# Patient Record
Sex: Female | Born: 1965 | Race: White | Hispanic: No | Marital: Married | Smoking: Never smoker
Health system: Southern US, Community
[De-identification: ages and names within clinical notes are randomized; demographics above are authoritative.]

## PROBLEM LIST (undated history)

## (undated) DIAGNOSIS — R599 Enlarged lymph nodes, unspecified: Secondary | ICD-10-CM

## (undated) DIAGNOSIS — Z860100 Personal history of colon polyps, unspecified: Secondary | ICD-10-CM

## (undated) DIAGNOSIS — R519 Headache, unspecified: Secondary | ICD-10-CM

## (undated) DIAGNOSIS — I493 Ventricular premature depolarization: Secondary | ICD-10-CM

## (undated) DIAGNOSIS — D72829 Elevated white blood cell count, unspecified: Secondary | ICD-10-CM

## (undated) DIAGNOSIS — D219 Benign neoplasm of connective and other soft tissue, unspecified: Secondary | ICD-10-CM

## (undated) DIAGNOSIS — J189 Pneumonia, unspecified organism: Secondary | ICD-10-CM

## (undated) DIAGNOSIS — K219 Gastro-esophageal reflux disease without esophagitis: Secondary | ICD-10-CM

## (undated) DIAGNOSIS — T8859XA Other complications of anesthesia, initial encounter: Secondary | ICD-10-CM

## (undated) DIAGNOSIS — E162 Hypoglycemia, unspecified: Secondary | ICD-10-CM

## (undated) DIAGNOSIS — F419 Anxiety disorder, unspecified: Secondary | ICD-10-CM

## (undated) DIAGNOSIS — R591 Generalized enlarged lymph nodes: Secondary | ICD-10-CM

## (undated) DIAGNOSIS — F32A Depression, unspecified: Secondary | ICD-10-CM

## (undated) DIAGNOSIS — R079 Chest pain, unspecified: Secondary | ICD-10-CM

## (undated) HISTORY — PX: ESOPHAGOGASTRODUODENOSCOPY ENDOSCOPY: SHX5814

## (undated) HISTORY — DX: Enlarged lymph nodes, unspecified: R59.9

## (undated) HISTORY — DX: Personal history of colon polyps, unspecified: Z86.0100

## (undated) HISTORY — DX: Elevated white blood cell count, unspecified: D72.829

## (undated) HISTORY — DX: Benign neoplasm of connective and other soft tissue, unspecified: D21.9

## (undated) HISTORY — PX: BACK SURGERY: SHX140

## (undated) HISTORY — DX: Generalized enlarged lymph nodes: R59.1

## (undated) HISTORY — PX: COLONOSCOPY: SHX174

## (undated) HISTORY — DX: Anxiety disorder, unspecified: F41.9

## (undated) HISTORY — DX: Personal history of colonic polyps: Z86.010

## (undated) HISTORY — PX: OTHER SURGICAL HISTORY: SHX169

---

## 2002-06-22 ENCOUNTER — Encounter: Payer: Self-pay | Admitting: Obstetrics and Gynecology

## 2002-06-22 ENCOUNTER — Encounter: Admission: RE | Admit: 2002-06-22 | Discharge: 2002-06-22 | Payer: Self-pay | Admitting: Obstetrics and Gynecology

## 2003-05-03 ENCOUNTER — Encounter: Admission: RE | Admit: 2003-05-03 | Discharge: 2003-05-03 | Payer: Self-pay | Admitting: Psychiatry

## 2003-05-09 ENCOUNTER — Encounter: Admission: RE | Admit: 2003-05-09 | Discharge: 2003-05-09 | Payer: Self-pay | Admitting: Psychiatry

## 2003-05-23 ENCOUNTER — Other Ambulatory Visit: Admission: RE | Admit: 2003-05-23 | Discharge: 2003-05-23 | Payer: Self-pay | Admitting: Obstetrics and Gynecology

## 2003-06-13 ENCOUNTER — Encounter: Admission: RE | Admit: 2003-06-13 | Discharge: 2003-06-13 | Payer: Self-pay | Admitting: Psychiatry

## 2003-06-22 ENCOUNTER — Encounter: Admission: RE | Admit: 2003-06-22 | Discharge: 2003-06-22 | Payer: Self-pay | Admitting: Psychiatry

## 2003-08-02 ENCOUNTER — Encounter: Admission: RE | Admit: 2003-08-02 | Discharge: 2003-08-02 | Payer: Self-pay | Admitting: Psychiatry

## 2003-09-13 ENCOUNTER — Encounter: Admission: RE | Admit: 2003-09-13 | Discharge: 2003-09-13 | Payer: Self-pay | Admitting: Internal Medicine

## 2003-09-13 ENCOUNTER — Encounter: Payer: Self-pay | Admitting: Internal Medicine

## 2003-11-30 ENCOUNTER — Encounter: Admission: RE | Admit: 2003-11-30 | Discharge: 2003-11-30 | Payer: Self-pay | Admitting: Internal Medicine

## 2004-03-26 ENCOUNTER — Encounter: Admission: RE | Admit: 2004-03-26 | Discharge: 2004-03-26 | Payer: Self-pay | Admitting: Family Medicine

## 2004-08-11 ENCOUNTER — Other Ambulatory Visit: Admission: RE | Admit: 2004-08-11 | Discharge: 2004-08-11 | Payer: Self-pay | Admitting: Obstetrics and Gynecology

## 2004-08-17 ENCOUNTER — Emergency Department (HOSPITAL_COMMUNITY): Admission: EM | Admit: 2004-08-17 | Discharge: 2004-08-17 | Payer: Self-pay | Admitting: *Deleted

## 2005-09-07 ENCOUNTER — Other Ambulatory Visit: Admission: RE | Admit: 2005-09-07 | Discharge: 2005-09-07 | Payer: Self-pay | Admitting: Obstetrics and Gynecology

## 2005-09-24 ENCOUNTER — Encounter: Admission: RE | Admit: 2005-09-24 | Discharge: 2005-09-24 | Payer: Self-pay | Admitting: Obstetrics and Gynecology

## 2006-10-04 ENCOUNTER — Other Ambulatory Visit: Admission: RE | Admit: 2006-10-04 | Discharge: 2006-10-04 | Payer: Self-pay | Admitting: Obstetrics and Gynecology

## 2006-11-02 ENCOUNTER — Encounter: Admission: RE | Admit: 2006-11-02 | Discharge: 2006-11-02 | Payer: Self-pay | Admitting: Obstetrics and Gynecology

## 2006-11-02 IMAGING — MG MM SCREEN MAMMOGRAM BILATERAL
5 series · 5 of 5 positions shown · non-contrast
Comparison: none

DG SCREEN MAMMOGRAM BILATERAL
Bilateral CC and MLO view(s) were taken.

DIGITAL SCREENING MAMMOGRAM WITH CAD:
There is a  dense fibroglandular pattern.  No masses or malignant type calcifications are 
identified.  Compared with prior studies.

[R CC]
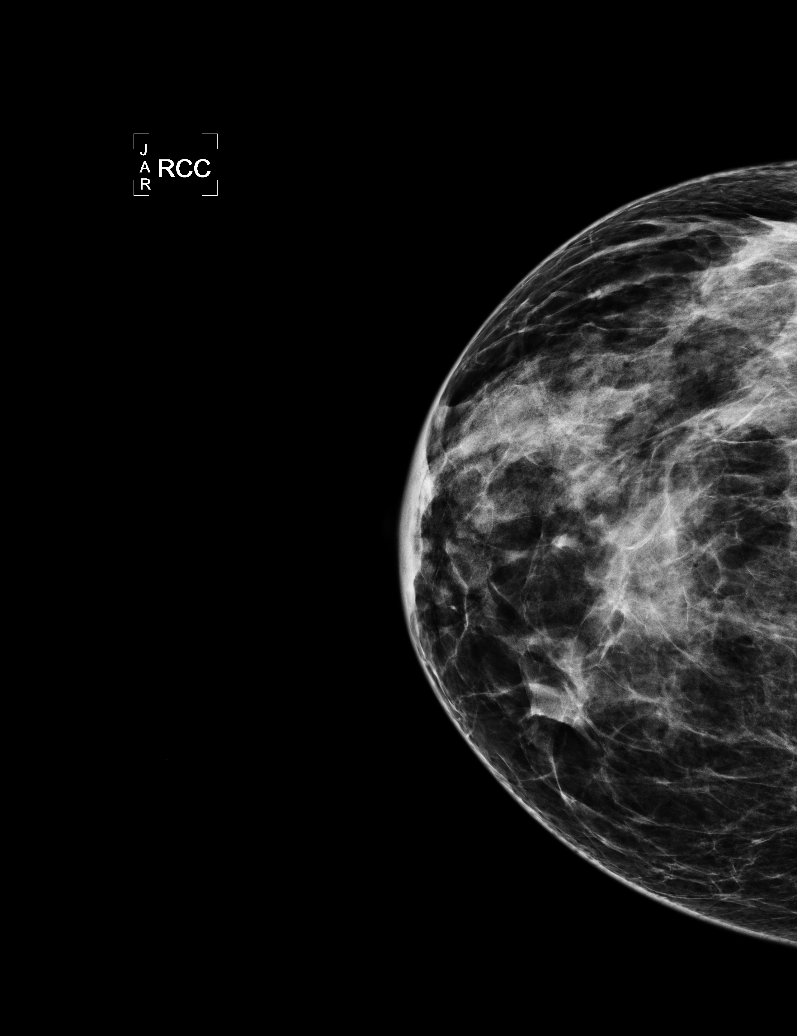

[L CC]
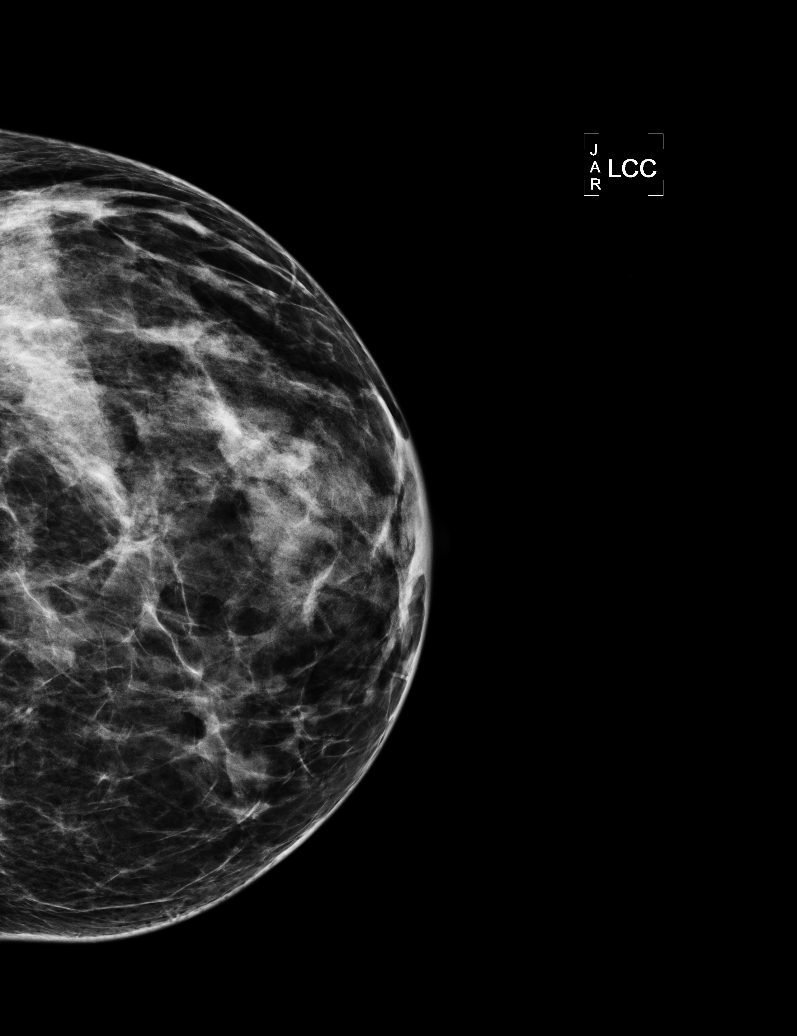

[L MLO]
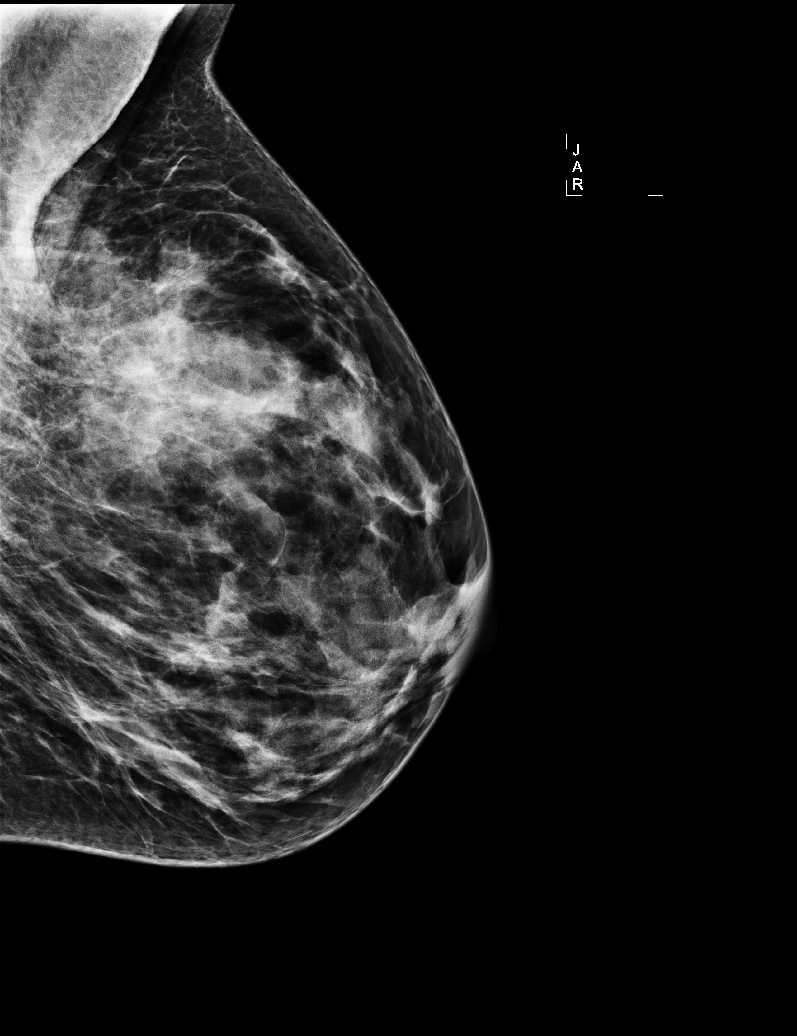

[R MLO (1 of 2)]
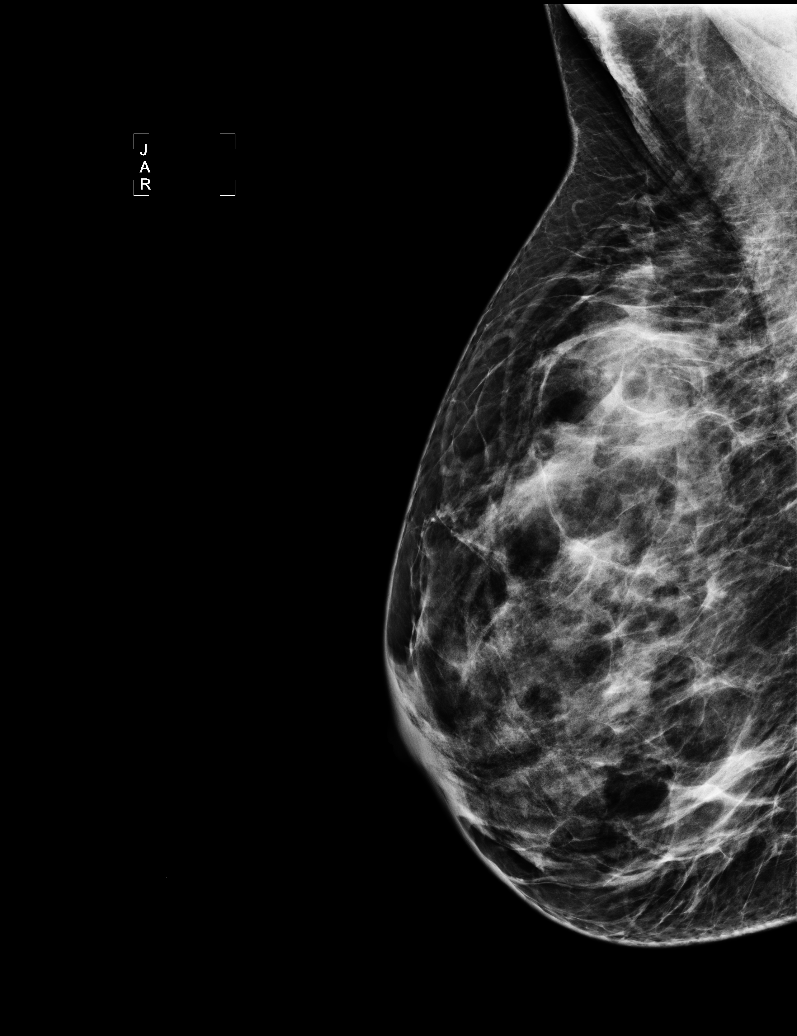

[R MLO (2 of 2)]
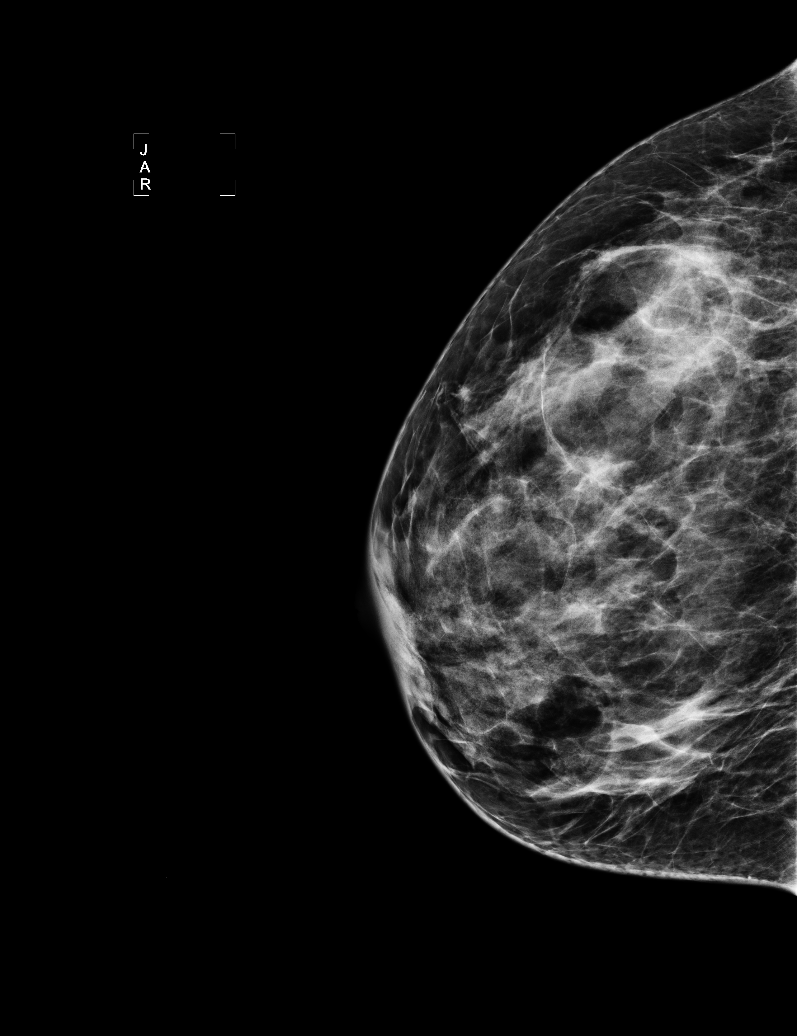

[5 of 5 positions shown; findings below may reference images not displayed]

IMPRESSION: No specific mammographic evidence of malignancy.  Next screening mammogram is recommended in one 
year.

ASSESSMENT: Negative - BI-RADS 1

Screening mammogram in 1 year.
ANALYZED BY COMPUTER AIDED DETECTION. , THIS PROCEDURE WAS A DIGITAL MAMMOGRAM.

## 2007-06-29 ENCOUNTER — Emergency Department (HOSPITAL_COMMUNITY): Admission: EM | Admit: 2007-06-29 | Discharge: 2007-06-30 | Payer: Self-pay | Admitting: Emergency Medicine

## 2007-11-04 ENCOUNTER — Encounter: Admission: RE | Admit: 2007-11-04 | Discharge: 2007-11-04 | Payer: Self-pay | Admitting: Obstetrics and Gynecology

## 2007-11-04 IMAGING — MG MM SCREEN MAMMOGRAM BILATERAL
4 series · 4 of 4 positions shown · non-contrast
Comparison: none

DG SCREEN MAMMOGRAM BILATERAL
Bilateral CC and MLO view(s) were taken.

DIGITAL SCREENING MAMMOGRAM WITH CAD:
The breast tissue is heterogeneously dense.  No masses or malignant type calcifications are 
identified.  Compared with prior studies.

[R CC]
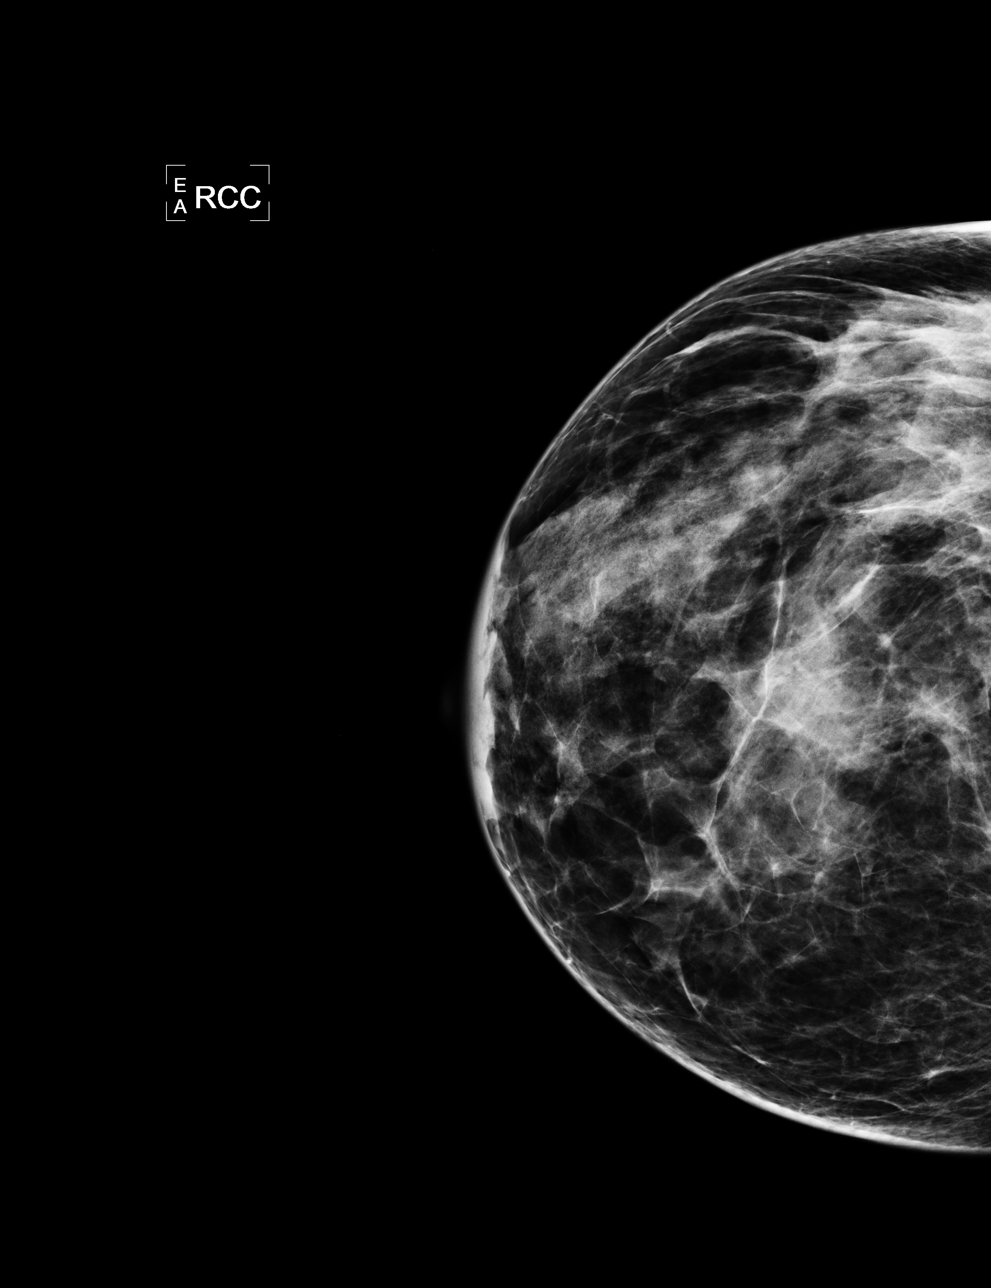

[L CC]
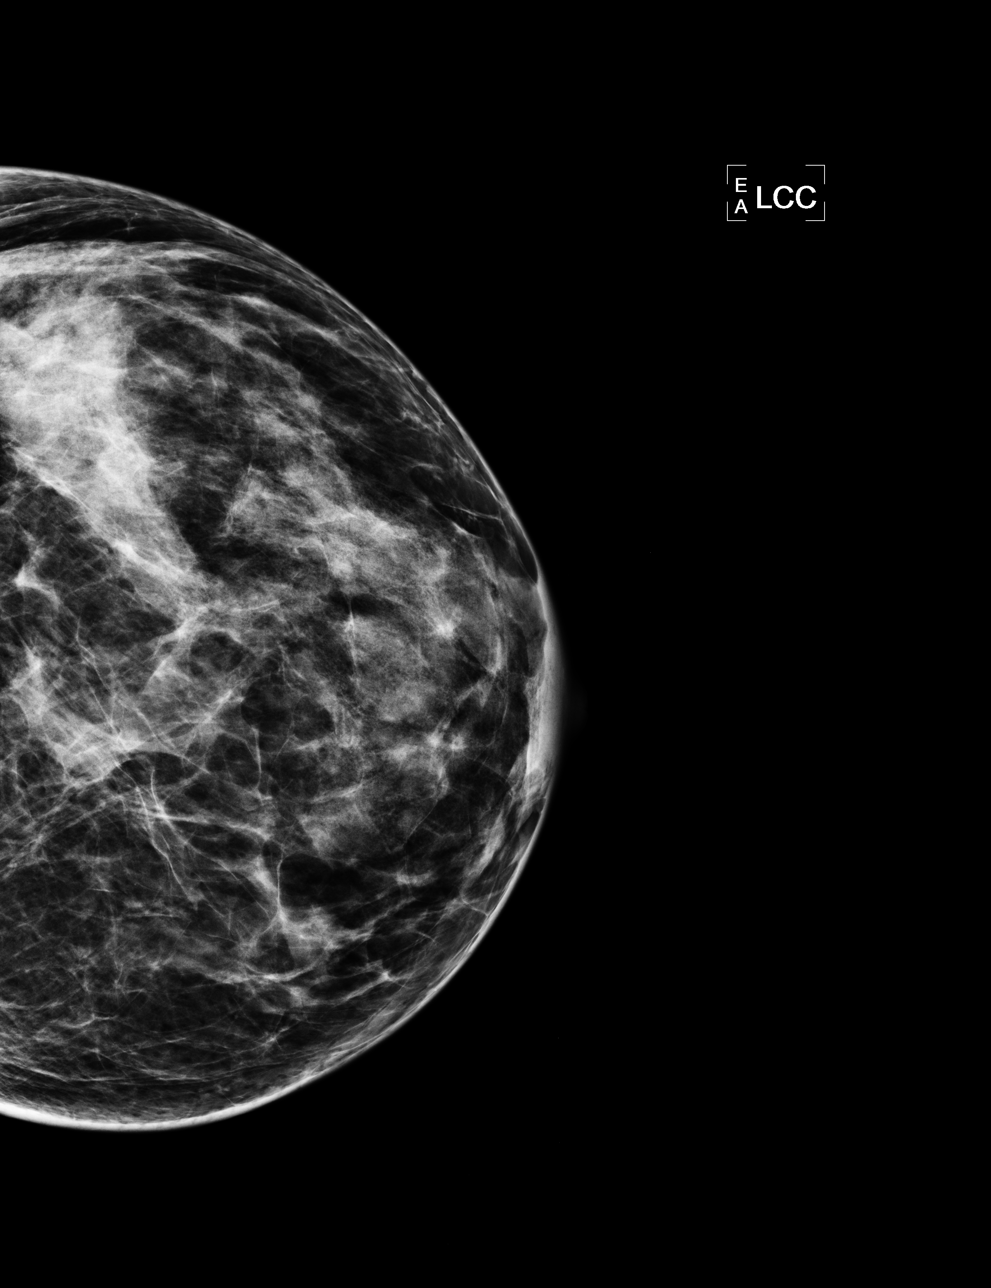

[L MLO]
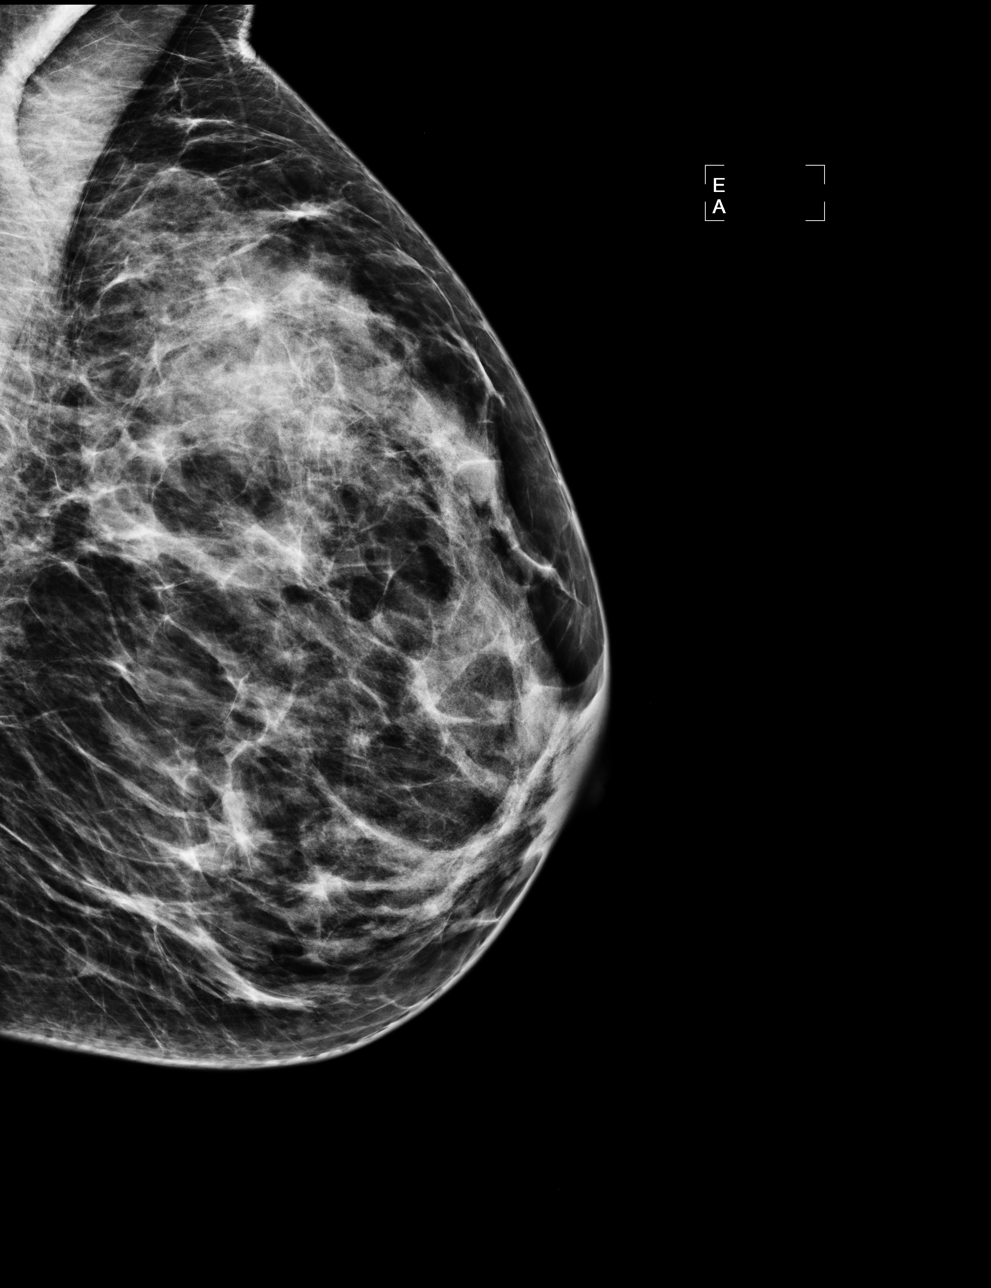

[R MLO]
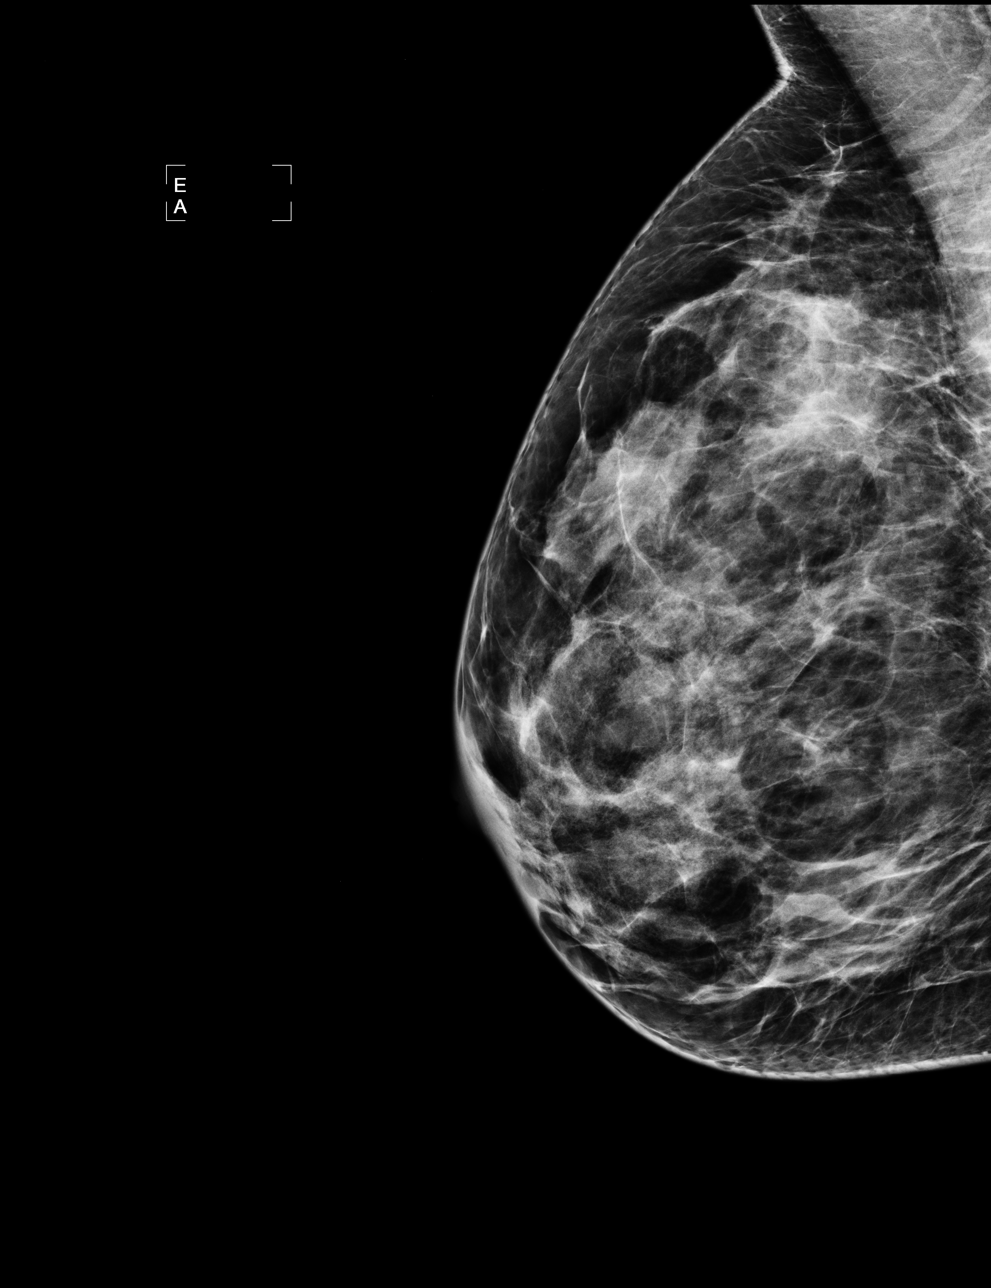

[4 of 4 positions shown; findings below may reference images not displayed]

IMPRESSION: No specific mammographic evidence of malignancy.  Next screening mammogram is recommended in one 
year.

ASSESSMENT: Negative - BI-RADS 1

Screening mammogram in 1 year.
ANALYZED BY COMPUTER AIDED DETECTION. , THIS PROCEDURE WAS A DIGITAL MAMMOGRAM.

## 2007-12-02 IMAGING — US US ABDOMEN COMPLETE
1 series · 14 of 25 positions shown · non-contrast
Comparison: NONE

CLINICAL DATA: Abdominal ???burning??. 

ABDOMINAL ULTRASOUND

[Series 1: us abd · 0.23mm/px · 14 of 75 slices shown]
[im 1/75]
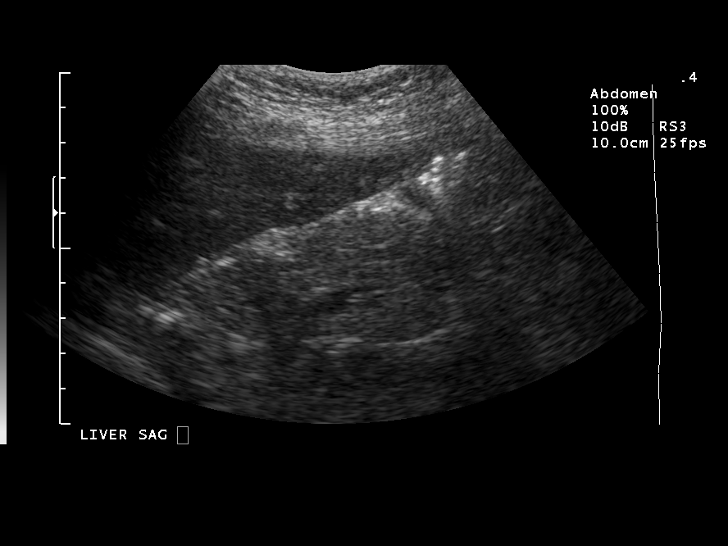
[im 7/75]
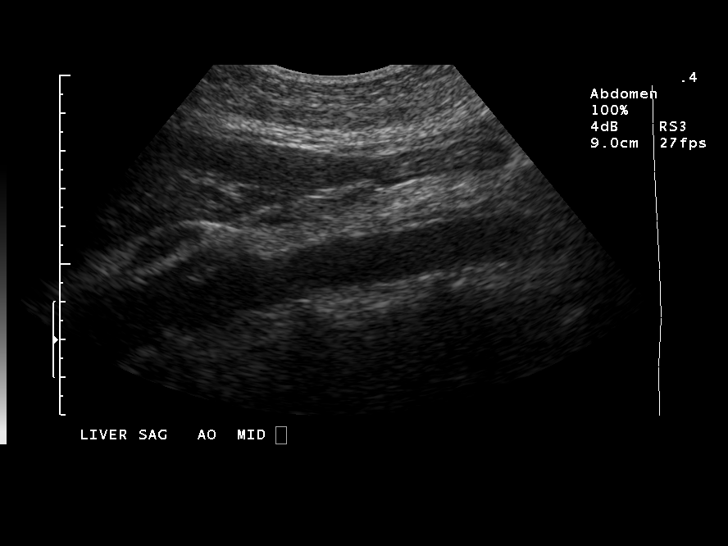
[im 13/75]
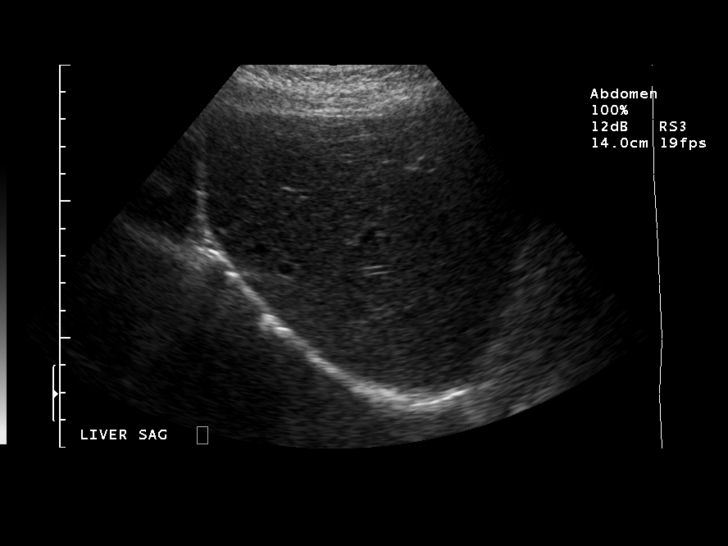
[im 19/75]
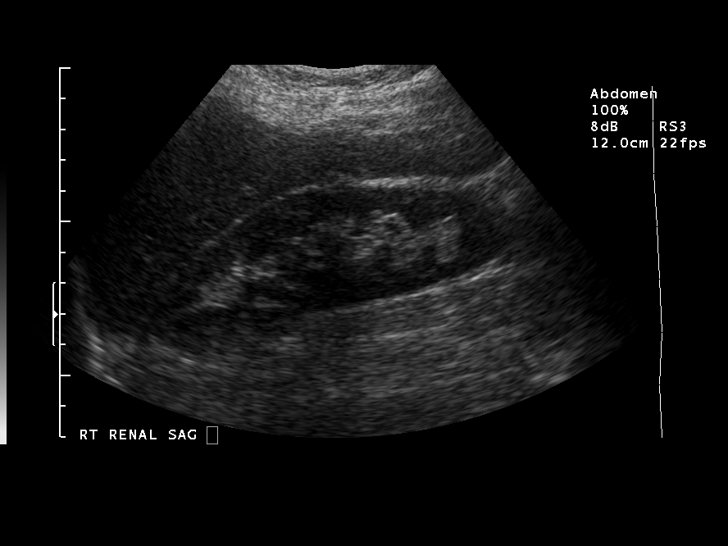
[im 25/75]
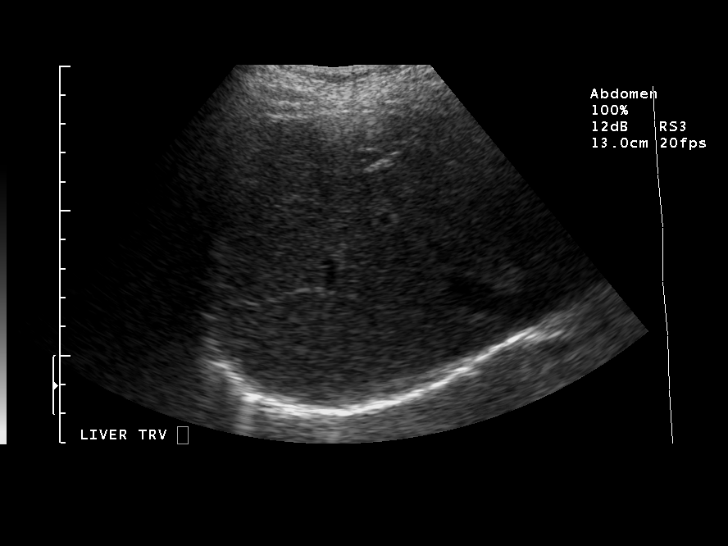
[im 28/75]
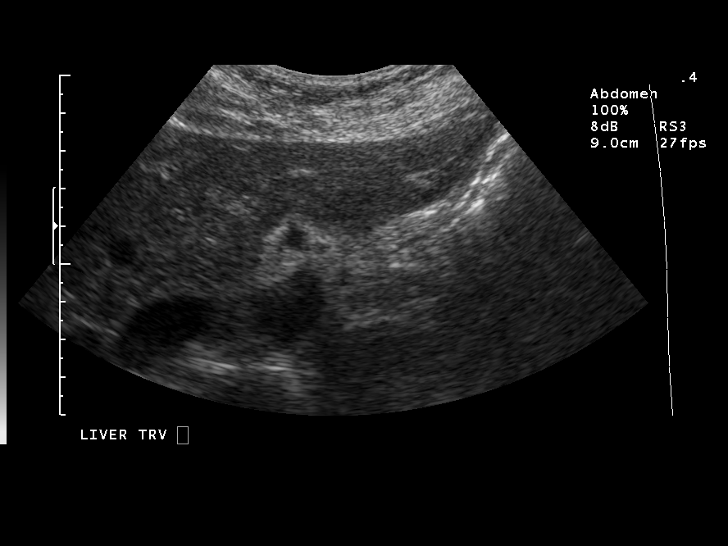
[im 34/75]
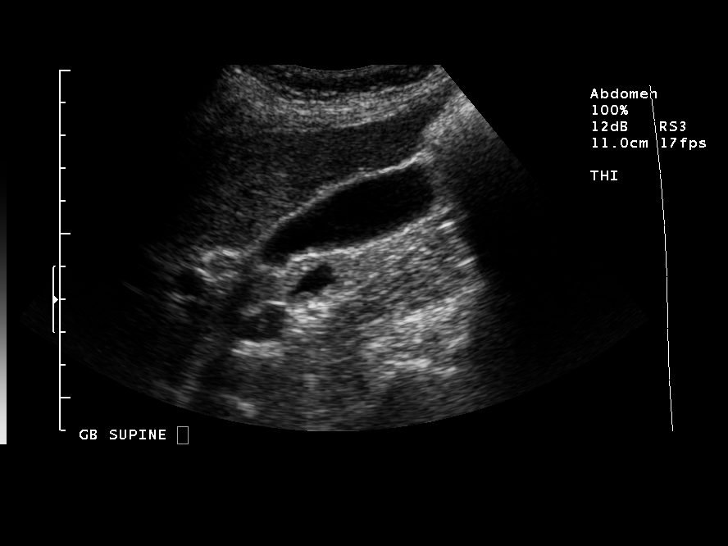
[im 41/75]
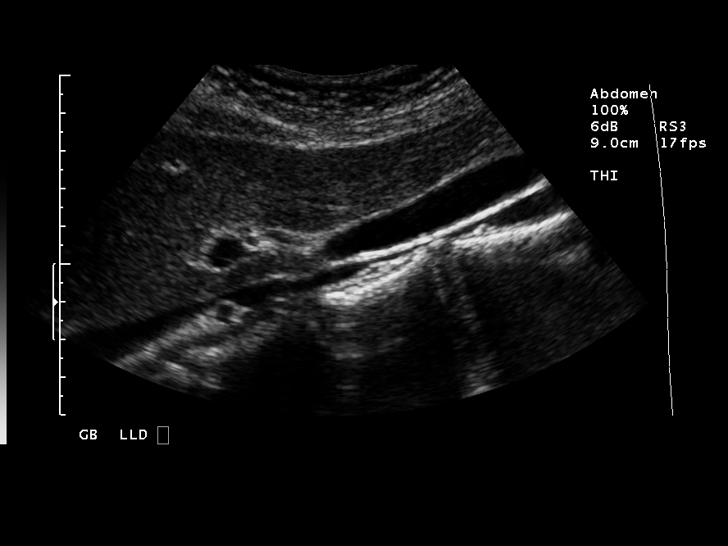
[im 47/75]
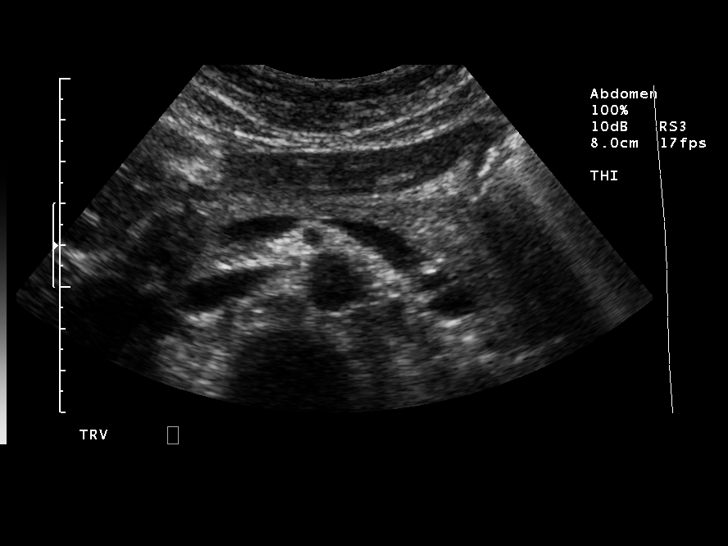
[im 50/75]
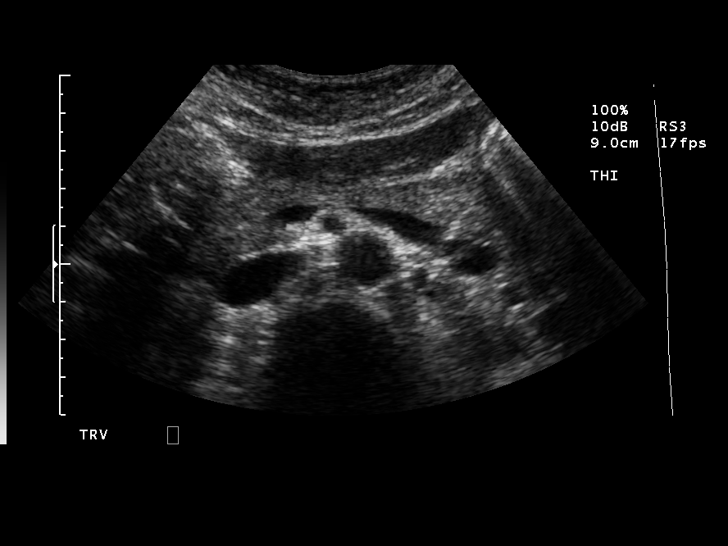
[im 56/75]
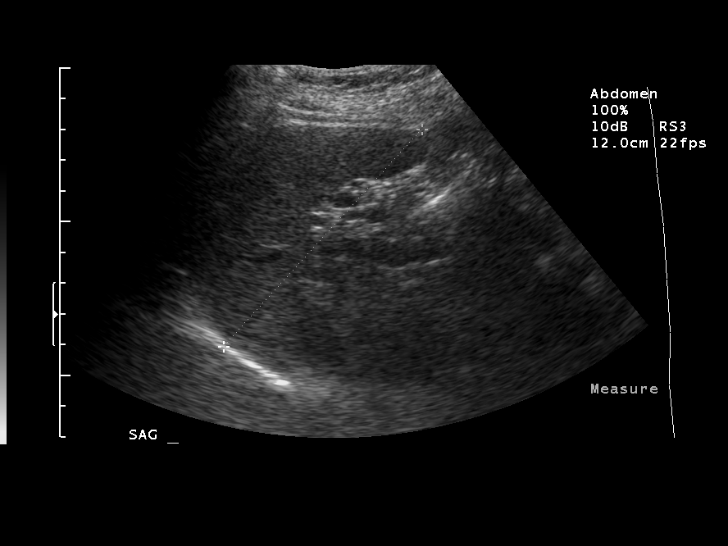
[im 62/75]
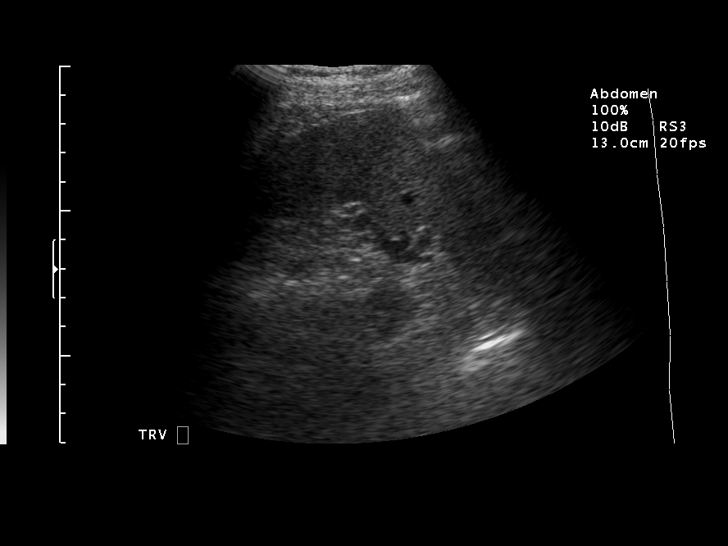
[im 68/75]
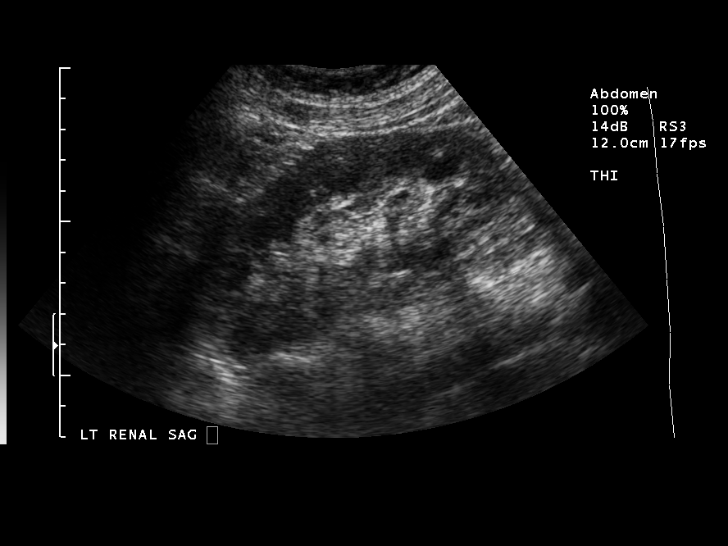
[im 75/75]
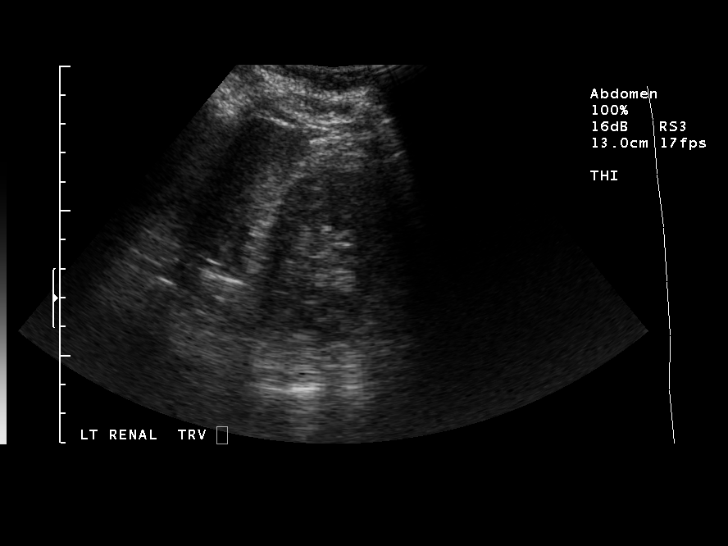

[14 of 25 positions shown; findings below may reference images not displayed]

FINDINGS: The liver is normal in size. Echogenicity is mildly 
heterogeneous. The gallbladder is normal in appearance without 
stones present. The common duct is non-dilated measuring 0.49 cm. 
The pancreas is well visualized and is normal in appearance. The 
abdominal aorta is normal in caliber and echo appearance with AP 
measurements of 1.9 cm proximally, mid 1.3 cm, and distally
cm. The spleen measures 9.5 cm and is normal in echogenicity. Both 
kidneys are normal in size and echo appearance. The right kidney 
measures 12 cm in length, and the left kidney measures 11 cm in 
length.
IMPRESSION: Mild echo heterogeneity of the liver, otherwise 
negative study. JARQUIN, M.D. electronically reviewed 
on [DATE] Dict Date: [DATE]  Tran Date: [DATE] CAV  [REDACTED]

## 2008-11-12 ENCOUNTER — Encounter: Admission: RE | Admit: 2008-11-12 | Discharge: 2008-11-12 | Payer: Self-pay | Admitting: Obstetrics and Gynecology

## 2008-11-12 IMAGING — MG MM SCREEN MAMMOGRAM BILATERAL
4 series · 4 of 4 positions shown · non-contrast
Comparison: none

DG SCREEN MAMMOGRAM BILATERAL
Bilateral CC and MLO view(s) were taken.

DIGITAL SCREENING MAMMOGRAM WITH CAD:
The breast tissue is extremely dense.  No masses or malignant type calcifications are identified.  
Compared with prior studies.

[R CC]
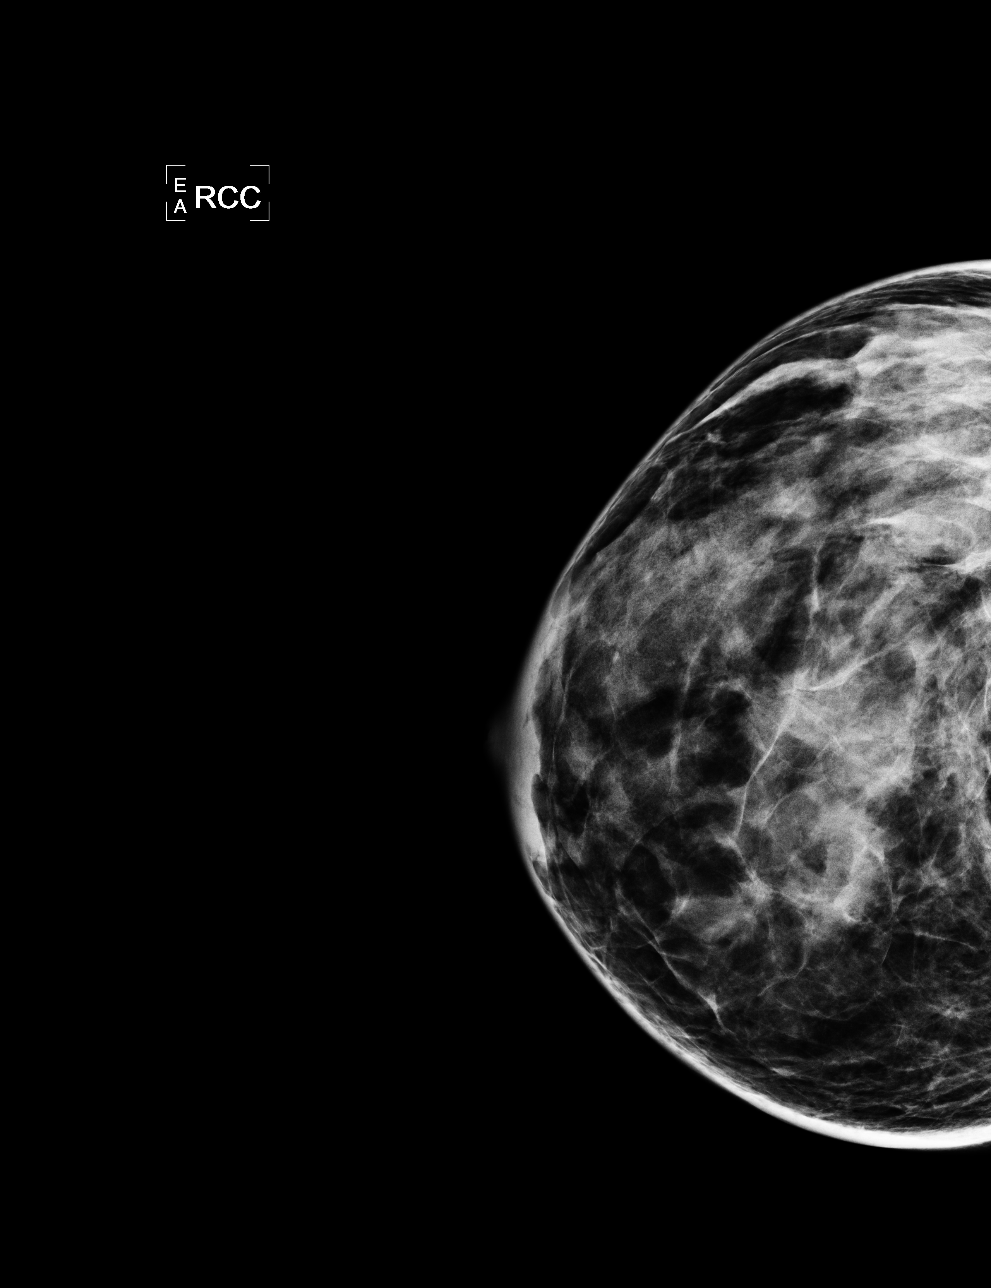

[L CC]
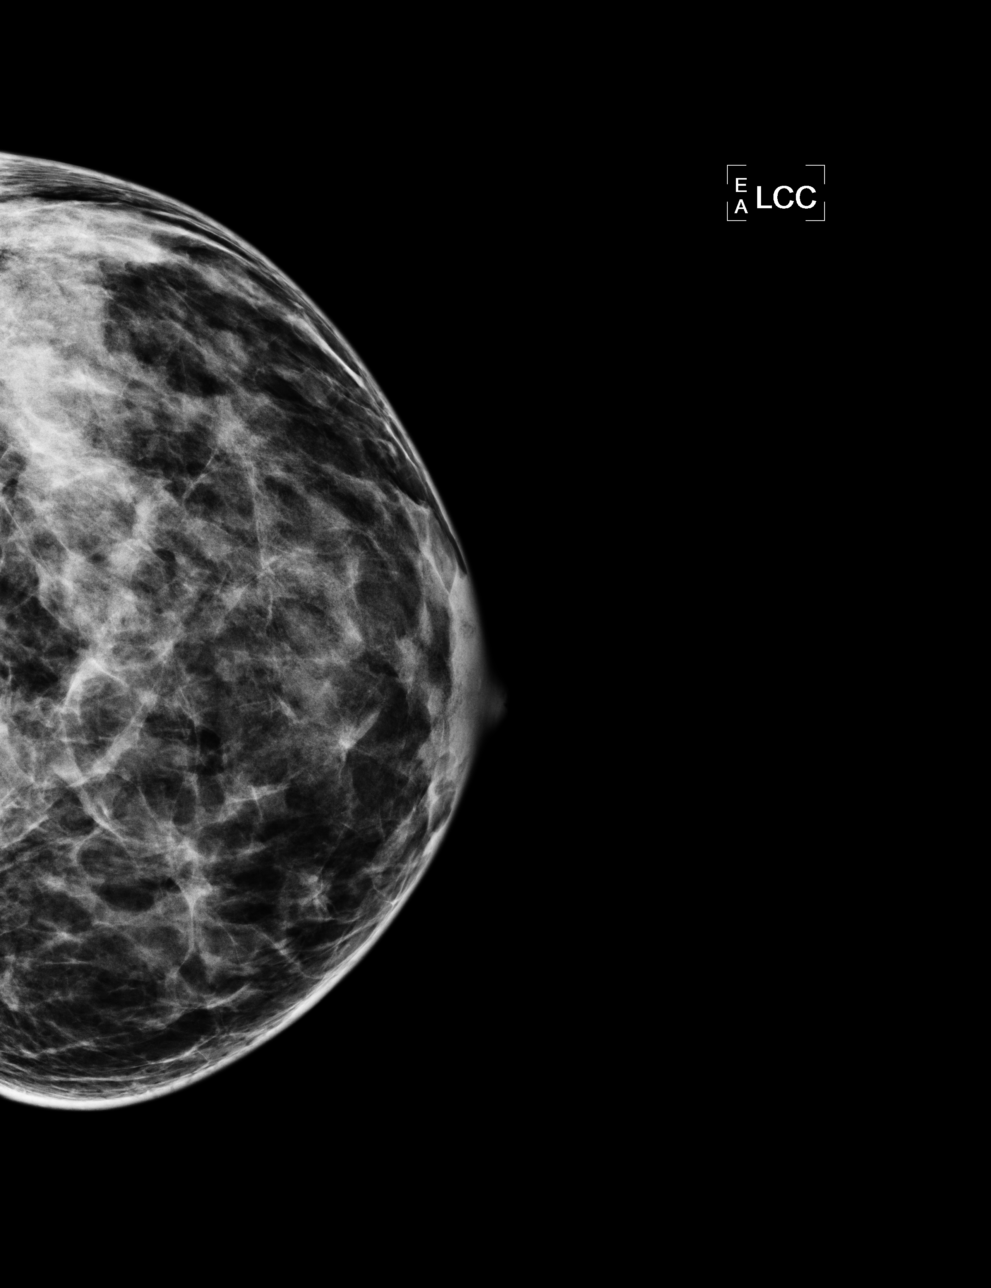

[L MLO]
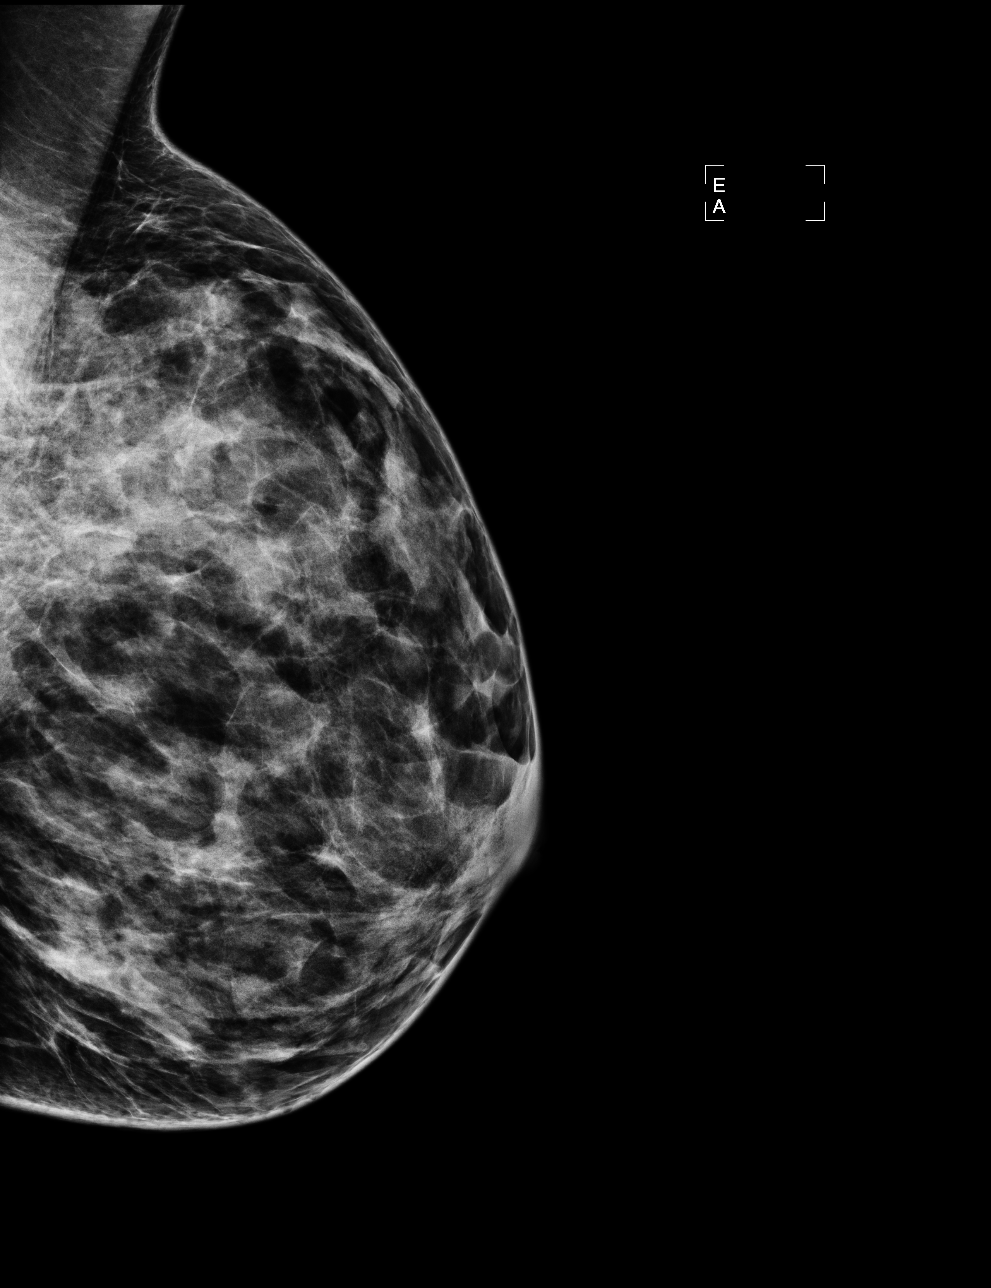

[R MLO]
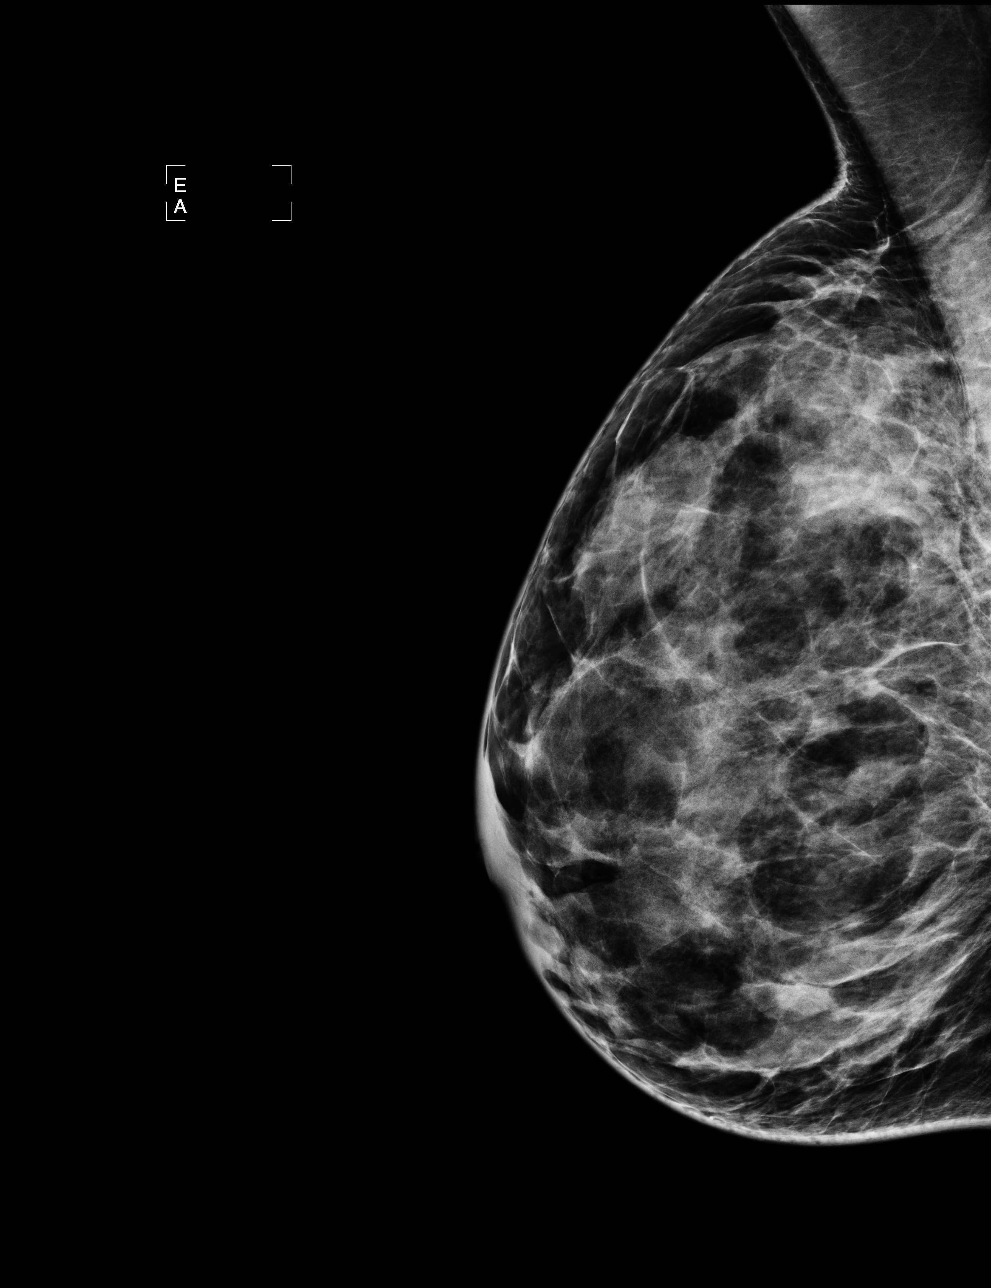

[4 of 4 positions shown; findings below may reference images not displayed]

IMPRESSION: No specific mammographic evidence of malignancy.  Next screening mammogram is recommended in one 
year.

ASSESSMENT: Negative - BI-RADS 1

Screening mammogram in 1 year.
ANALYZED BY COMPUTER AIDED DETECTION. , THIS PROCEDURE WAS A DIGITAL MAMMOGRAM.

## 2009-12-19 ENCOUNTER — Encounter: Admission: RE | Admit: 2009-12-19 | Discharge: 2009-12-19 | Payer: Self-pay | Admitting: Obstetrics and Gynecology

## 2009-12-19 IMAGING — MG MM DIGITAL SCREENING
4 series · 4 of 4 positions shown · non-contrast
Comparison: none

DG SCREEN MAMMOGRAM BILATERAL
Bilateral CC and MLO view(s) were taken.

DIGITAL SCREENING MAMMOGRAM WITH CAD:
The breast tissue is heterogeneously dense.  No masses or malignant type calcifications are 
identified.  Compared with prior studies.
Images were processed with CAD.

[R CC]
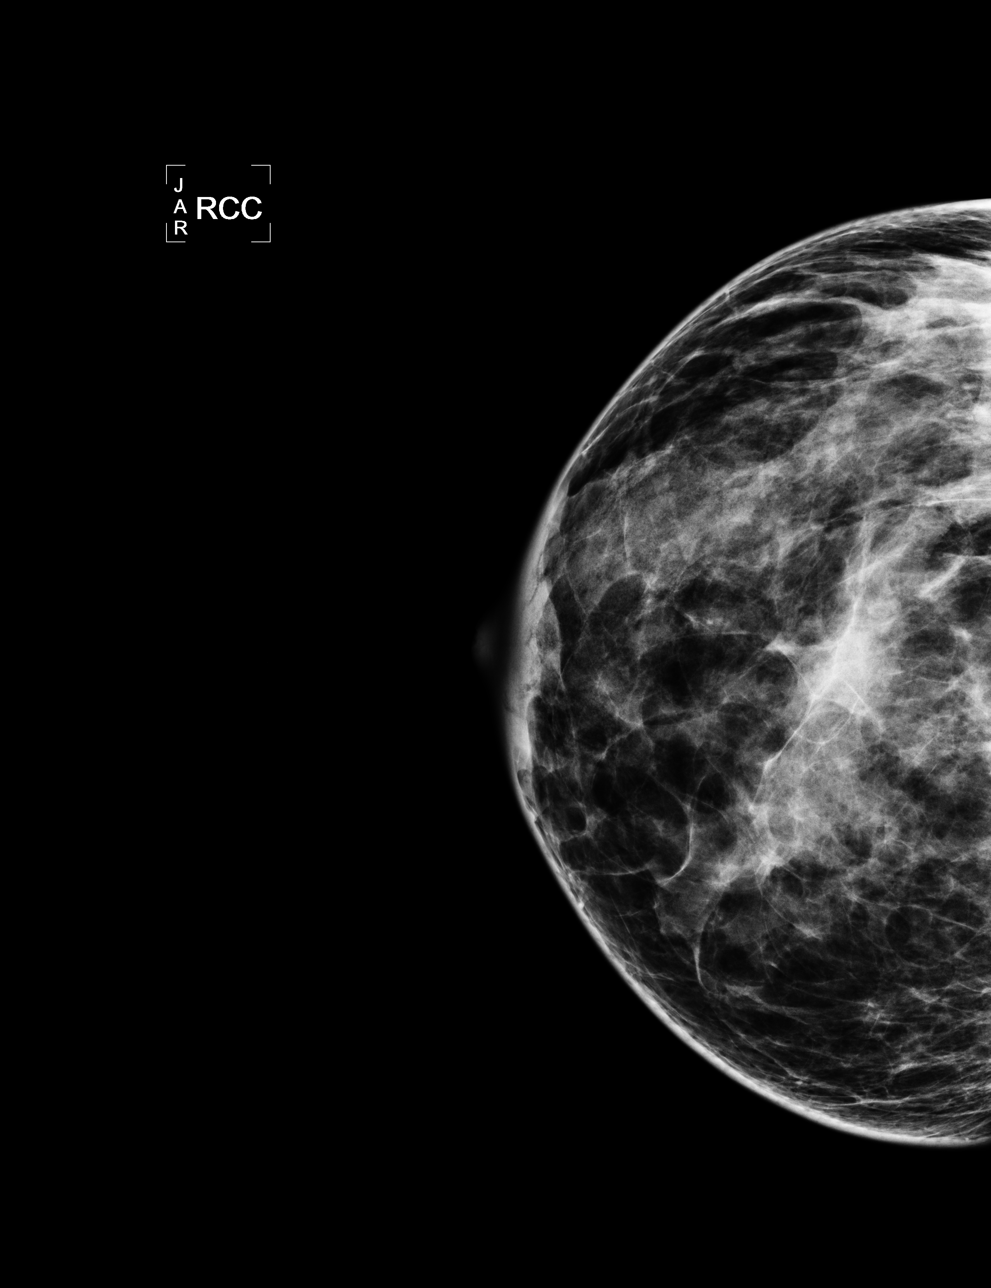

[L CC]
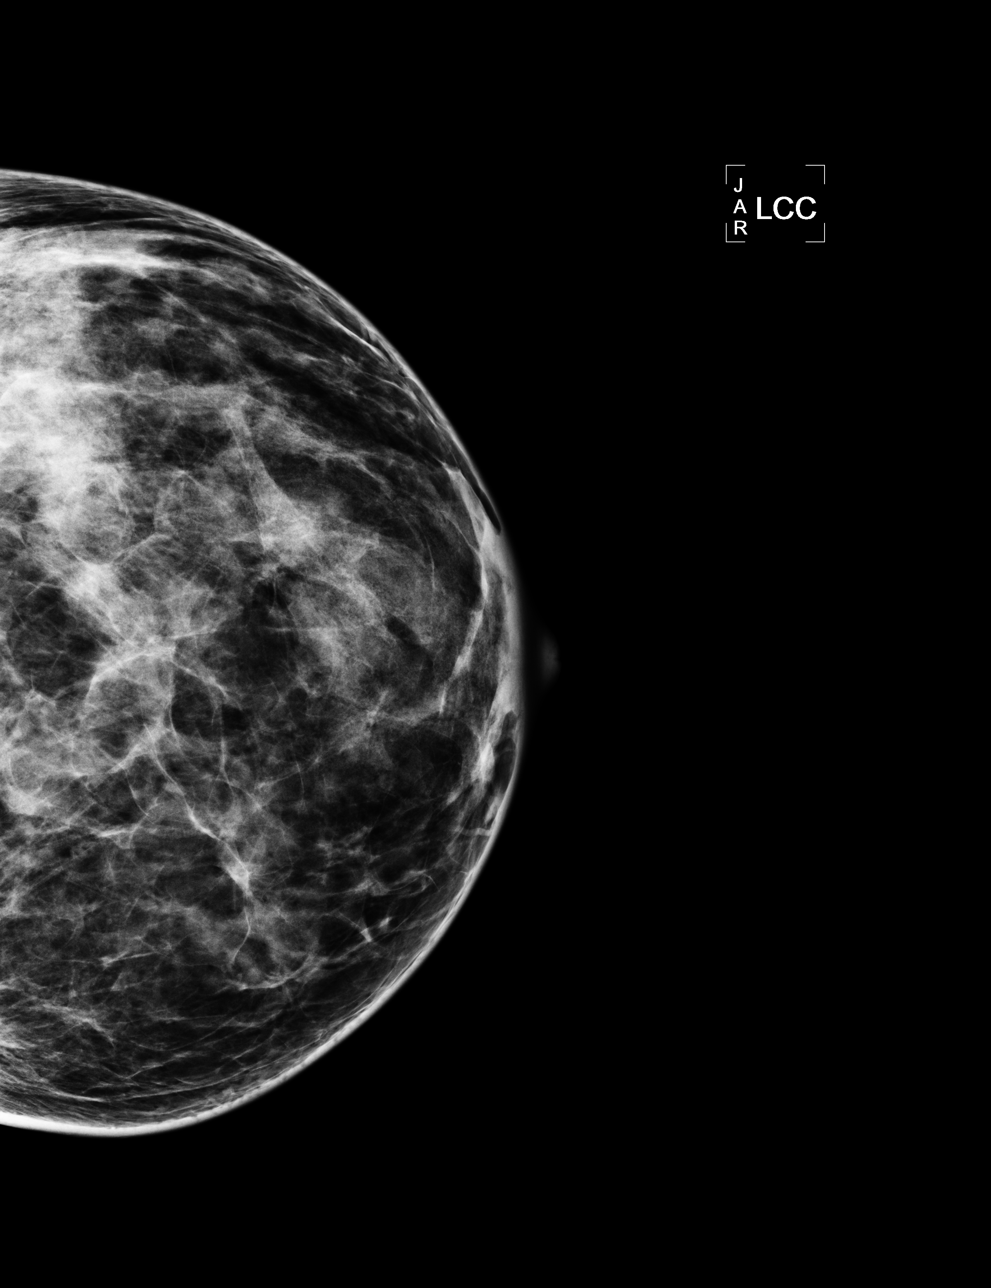

[L MLO]
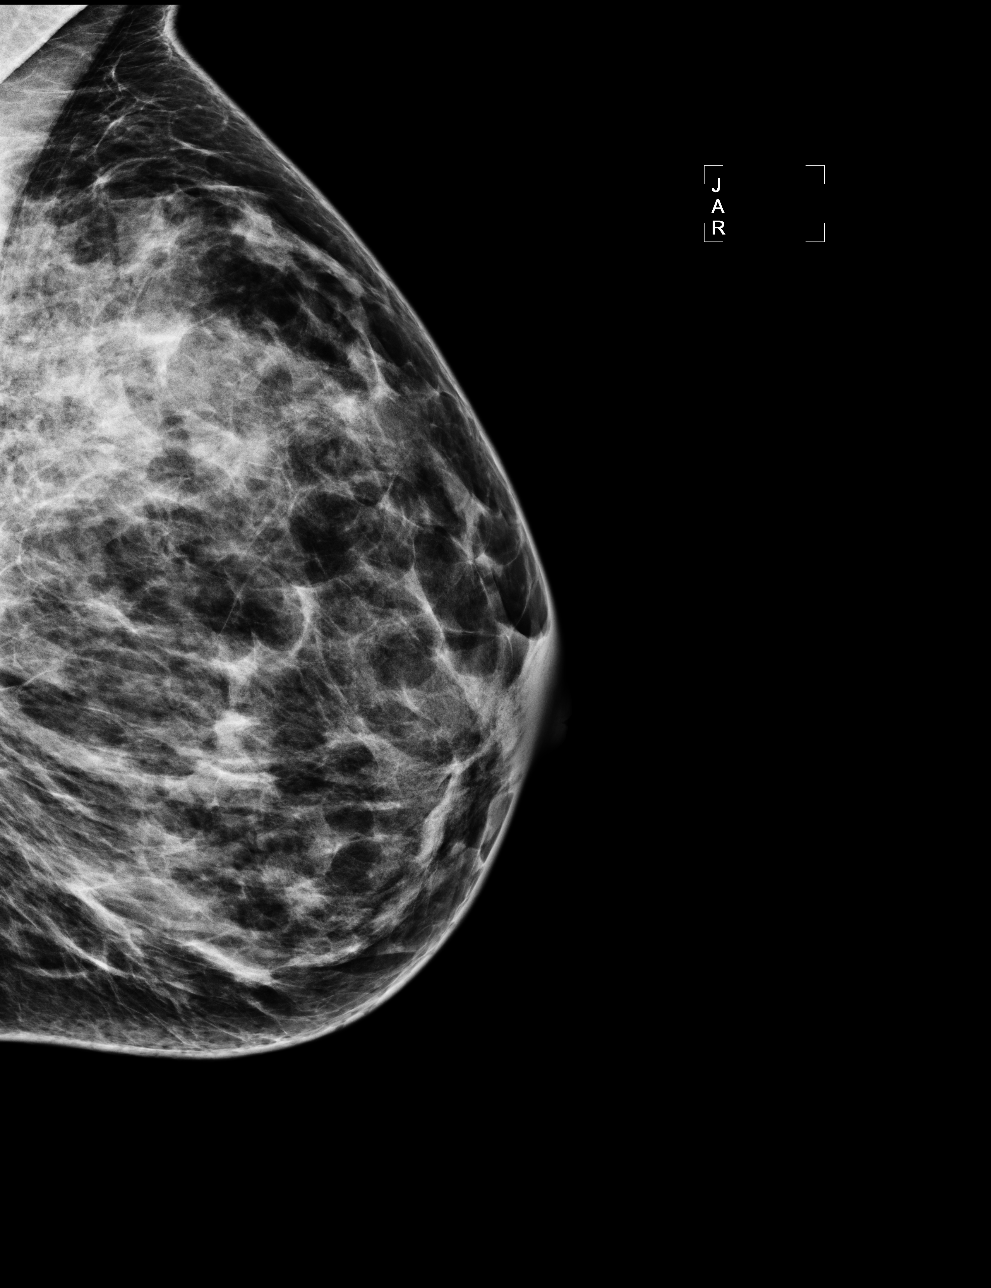

[R MLO]
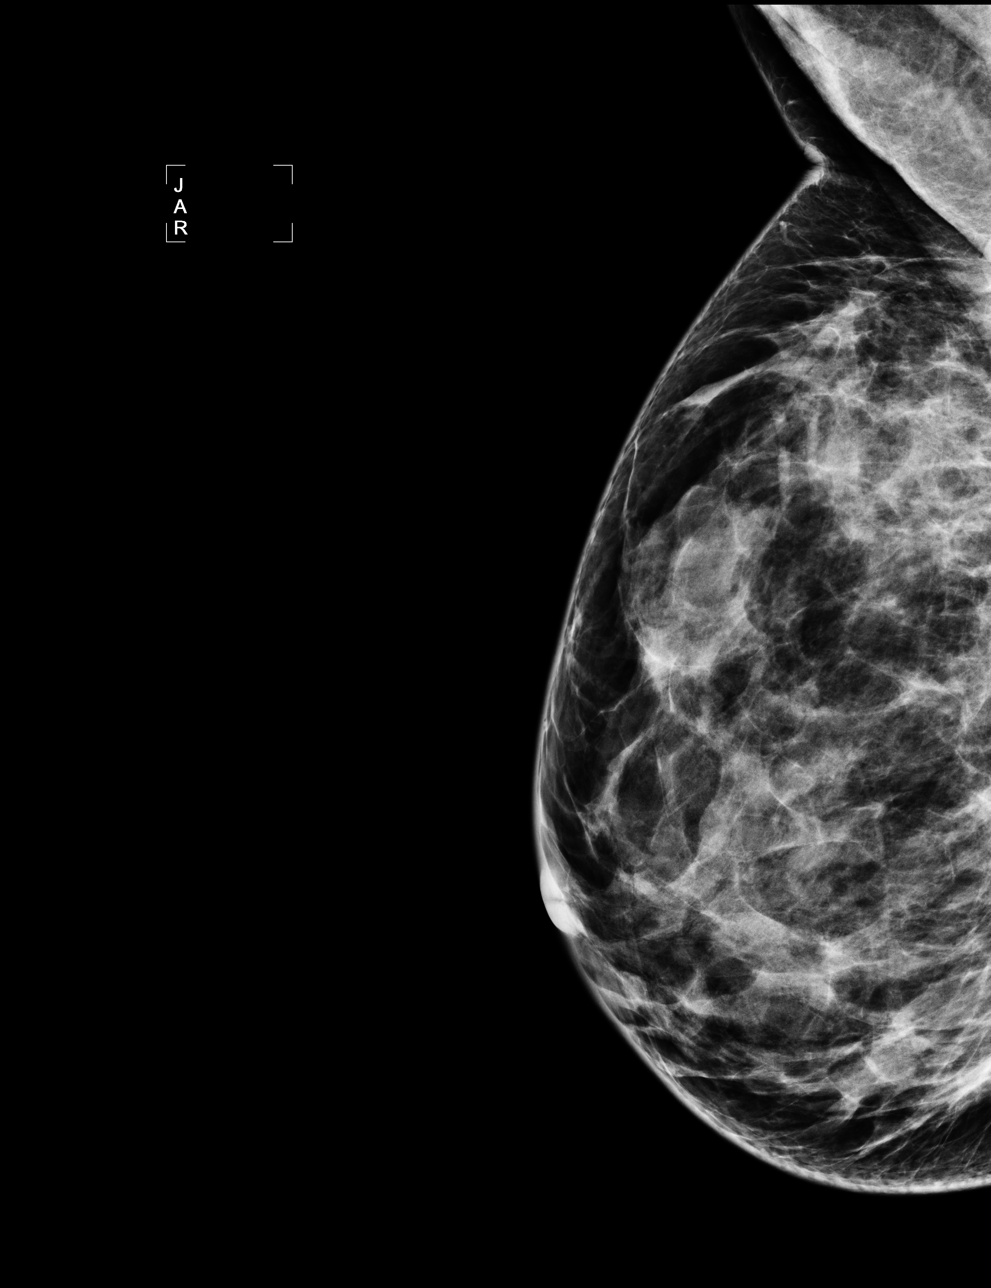

[4 of 4 positions shown; findings below may reference images not displayed]

IMPRESSION: No specific mammographic evidence of malignancy.  Next screening mammogram is recommended in one 
year.

A result letter of this screening mammogram will be mailed directly to the patient.

ASSESSMENT: Negative - BI-RADS 1

Screening mammogram in 1 year.
,

## 2010-10-21 DIAGNOSIS — D72829 Elevated white blood cell count, unspecified: Secondary | ICD-10-CM

## 2010-10-21 HISTORY — DX: Elevated white blood cell count, unspecified: D72.829

## 2010-12-24 ENCOUNTER — Encounter
Admission: RE | Admit: 2010-12-24 | Discharge: 2010-12-24 | Payer: Self-pay | Source: Home / Self Care | Attending: Obstetrics and Gynecology | Admitting: Obstetrics and Gynecology

## 2010-12-24 IMAGING — MG MM DIGITAL SCREENING
6 series · 6 of 6 positions shown · non-contrast
Comparison: Prior studies.

DG SCREEN MAMMOGRAM BILATERAL
Bilateral CC and MLO view(s) were taken.
Prior study comparison: [DATE], DG screen mammogram bilateral.

DIGITAL SCREENING MAMMOGRAM WITH CAD:

[R CC]
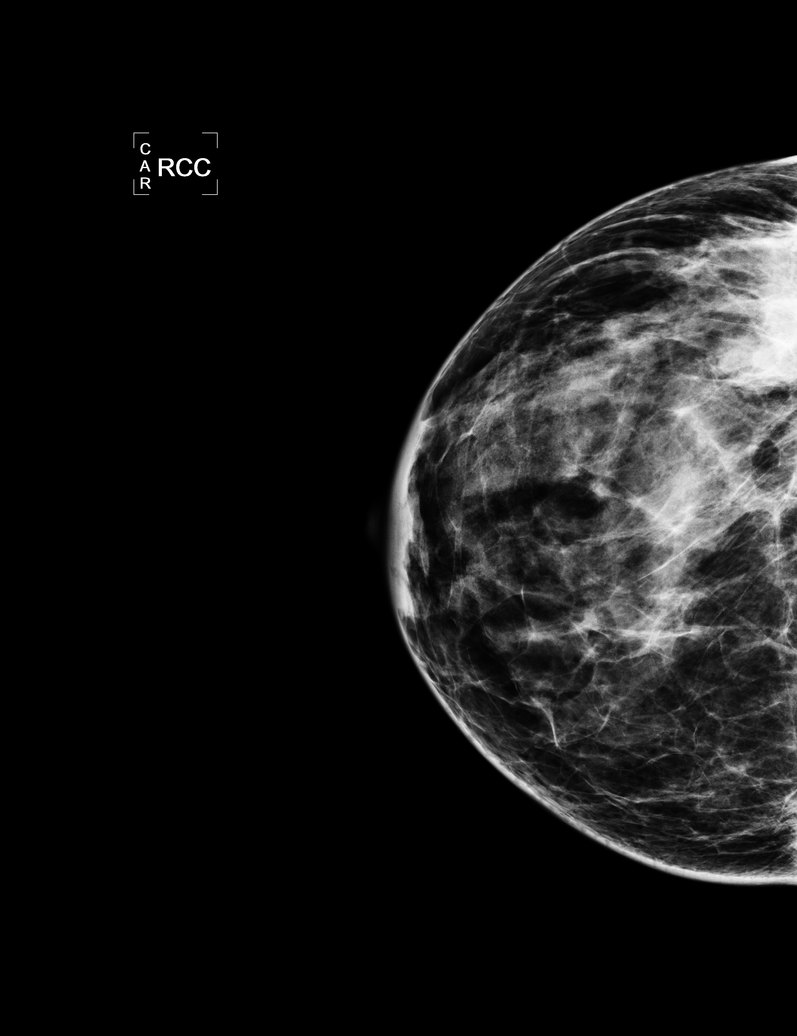

[L CC]
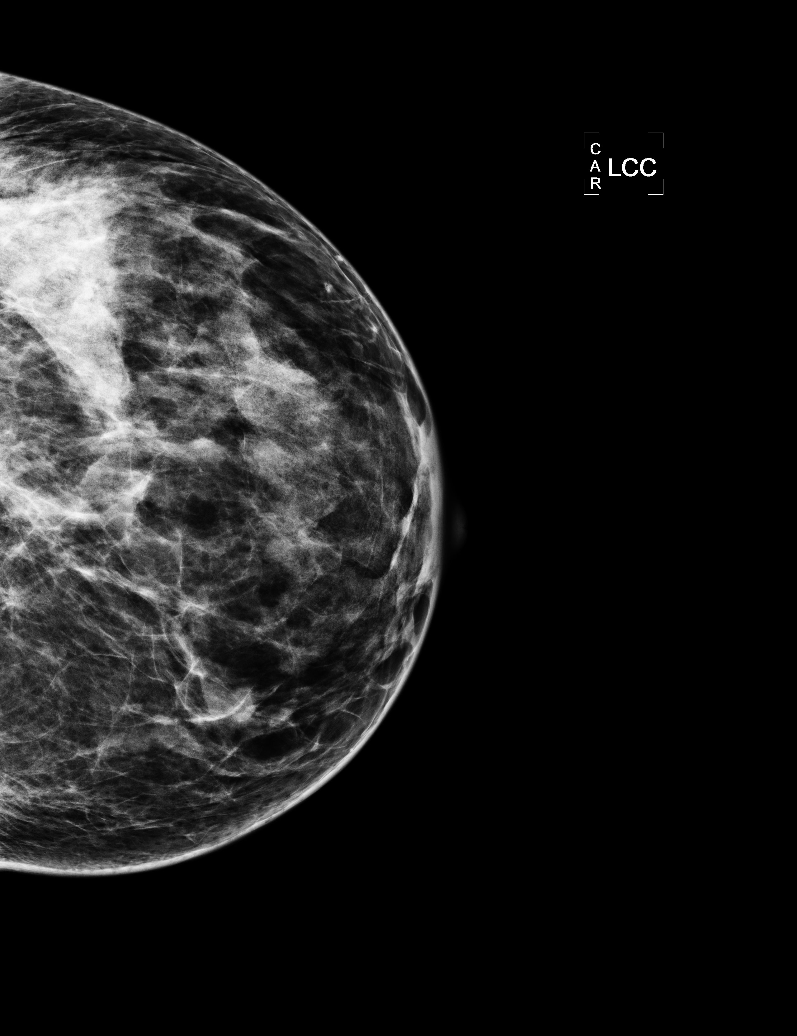

[L MLO]
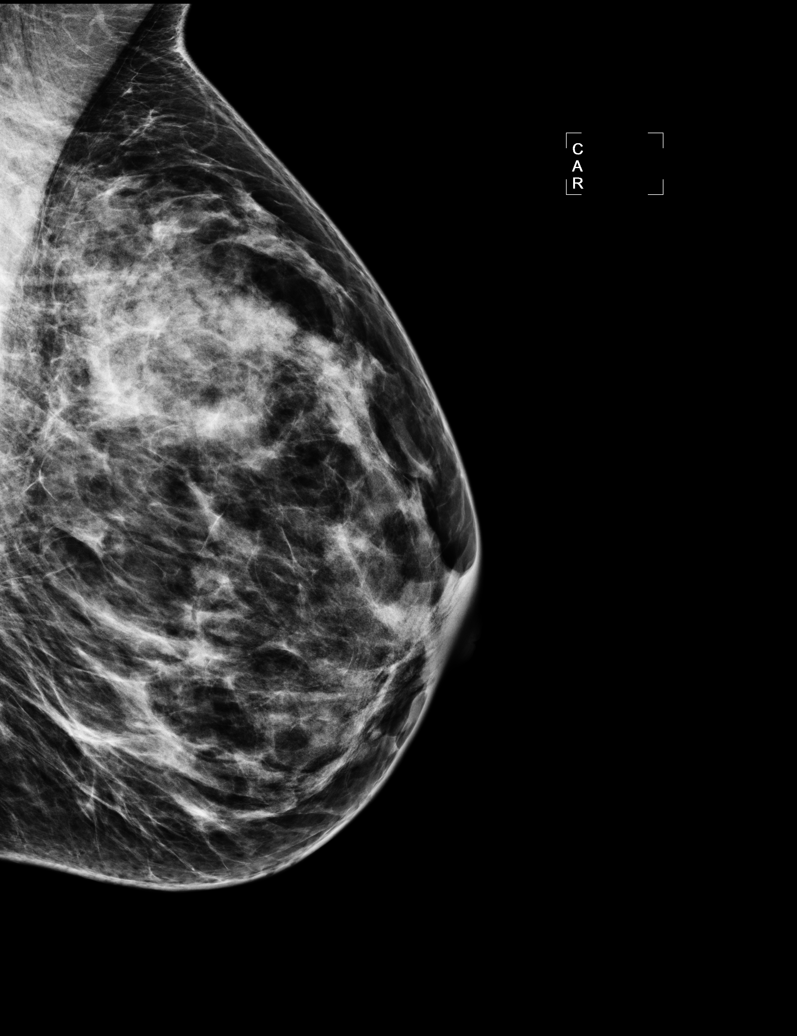

[R MLO]
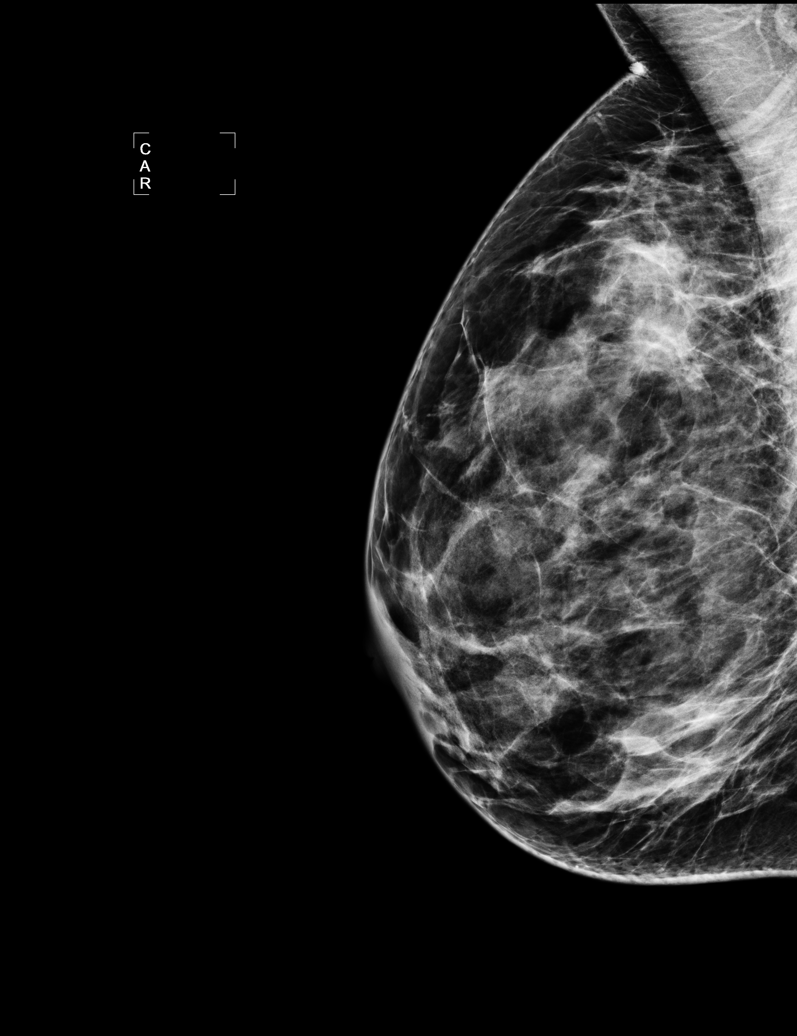

[L XCCL]
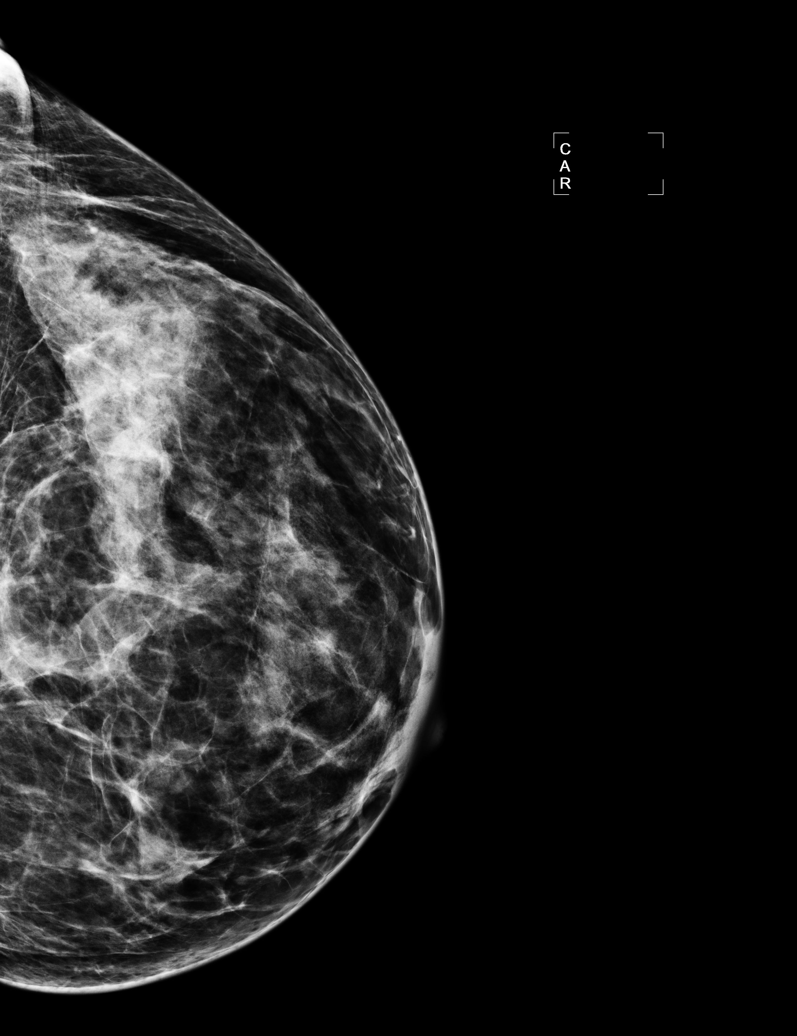

[R XCCL]
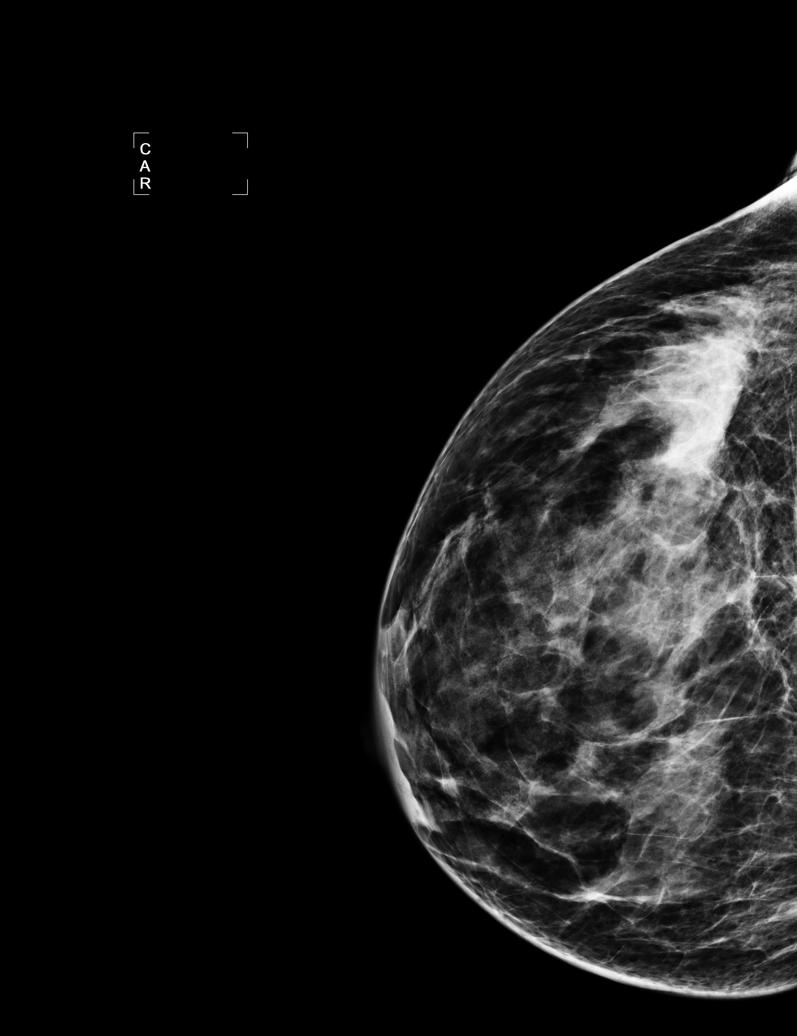

[6 of 6 positions shown; findings below may reference images not displayed]

There are scattered fibroglandular densities.  There is no dominant mass, architectural distortion 
or calcification to suggest malignancy.

Images were processed with CAD.
IMPRESSION: No mammographic evidence of malignancy.  Suggest yearly screening mammography.

A result letter of this screening mammogram will be mailed directly to the patient.

ASSESSMENT: Negative - BI-RADS 1

Screening mammogram in 1 year.
,

## 2011-08-04 ENCOUNTER — Other Ambulatory Visit: Payer: Self-pay | Admitting: Family Medicine

## 2011-08-04 DIAGNOSIS — H9319 Tinnitus, unspecified ear: Secondary | ICD-10-CM

## 2011-09-01 ENCOUNTER — Other Ambulatory Visit: Payer: Self-pay

## 2011-10-06 LAB — CBC
HCT: 43.8
Hemoglobin: 14.9
MCHC: 34
MCV: 89.8
Platelets: 225
RBC: 4.88
RDW: 11.8
WBC: 22.6 — ABNORMAL HIGH

## 2011-10-06 LAB — BASIC METABOLIC PANEL
BUN: 8
CO2: 27
Calcium: 9.2
Chloride: 100
Creatinine, Ser: 0.62
GFR calc Af Amer: >60
GFR calc non Af Amer: >60
Glucose, Bld: 147 — ABNORMAL HIGH
Potassium: 3.4 — ABNORMAL LOW
Sodium: 135

## 2011-10-06 LAB — DIFFERENTIAL
Basophils Absolute: 0.8 — ABNORMAL HIGH
Basophils Relative: 4 — ABNORMAL HIGH
Eosinophils Absolute: 0.1
Eosinophils Relative: 0
Lymphocytes Relative: 4 — ABNORMAL LOW
Lymphs Abs: 0.8
Monocytes Absolute: 1.1 — ABNORMAL HIGH
Monocytes Relative: 5
Neutro Abs: 19.8 — ABNORMAL HIGH
Neutrophils Relative %: 88 — ABNORMAL HIGH

## 2011-10-06 LAB — PREGNANCY, URINE: Preg Test, Ur: NEGATIVE

## 2012-01-12 ENCOUNTER — Other Ambulatory Visit: Payer: Self-pay | Admitting: Obstetrics and Gynecology

## 2012-01-12 DIAGNOSIS — Z1231 Encounter for screening mammogram for malignant neoplasm of breast: Secondary | ICD-10-CM

## 2012-01-26 ENCOUNTER — Ambulatory Visit
Admission: RE | Admit: 2012-01-26 | Discharge: 2012-01-26 | Disposition: A | Discharge status: Home / Self Care | Payer: 59 | Source: Ambulatory Visit | Attending: Obstetrics and Gynecology | Admitting: Obstetrics and Gynecology

## 2012-01-26 DIAGNOSIS — Z1231 Encounter for screening mammogram for malignant neoplasm of breast: Secondary | ICD-10-CM

## 2012-01-26 IMAGING — MG MM DIGITAL SCREENING BILAT
4 series · 4 of 4 positions shown · non-contrast
Comparison: none

DG SCREEN MAMMOGRAM BILATERAL
Bilateral CC and MLO view(s) were taken.

DIGITAL SCREENING MAMMOGRAM WITH CAD:
The breast tissue is heterogeneously dense.  Possible asymmetry is noted in the left breast.  Spot 
compression views and possibly sonography are recommended for further evaluation.  In the right 
breast, no masses or malignant type calcifications are identified.  Compared with prior studies.
Images were processed with CAD.

[R CC]
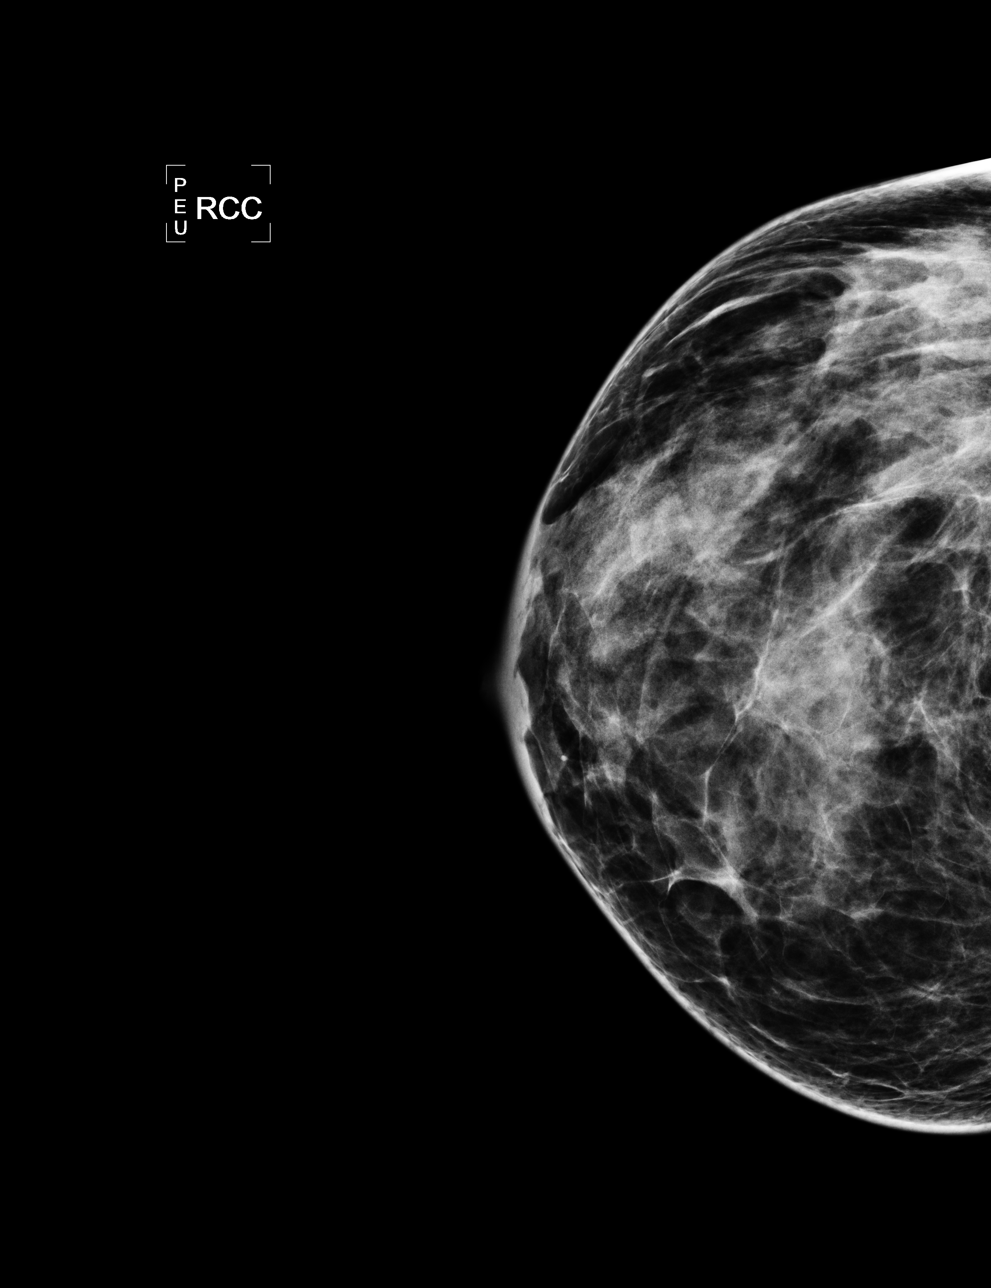

[L CC]
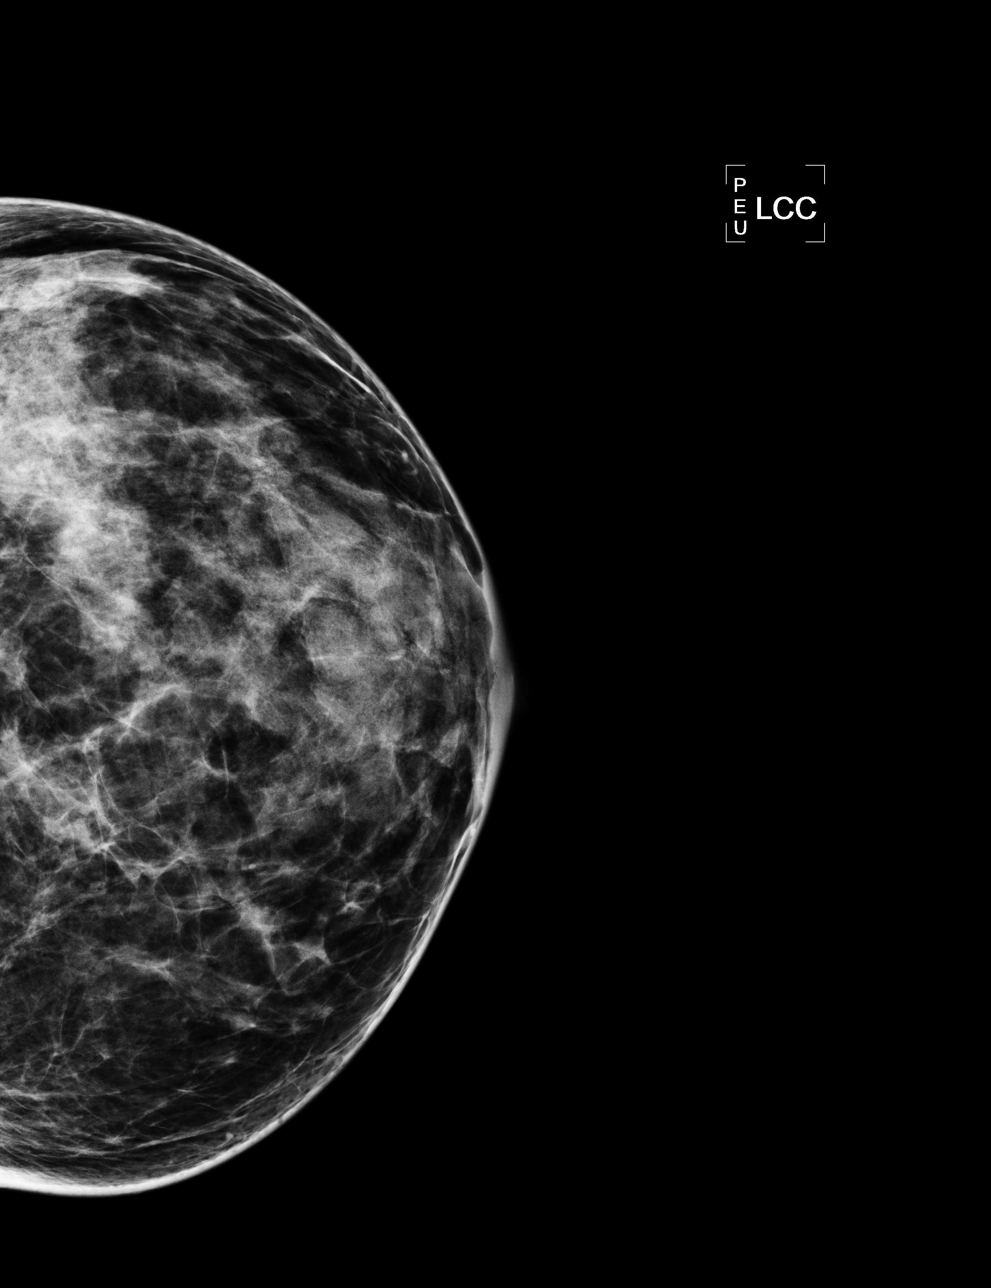

[L MLO]
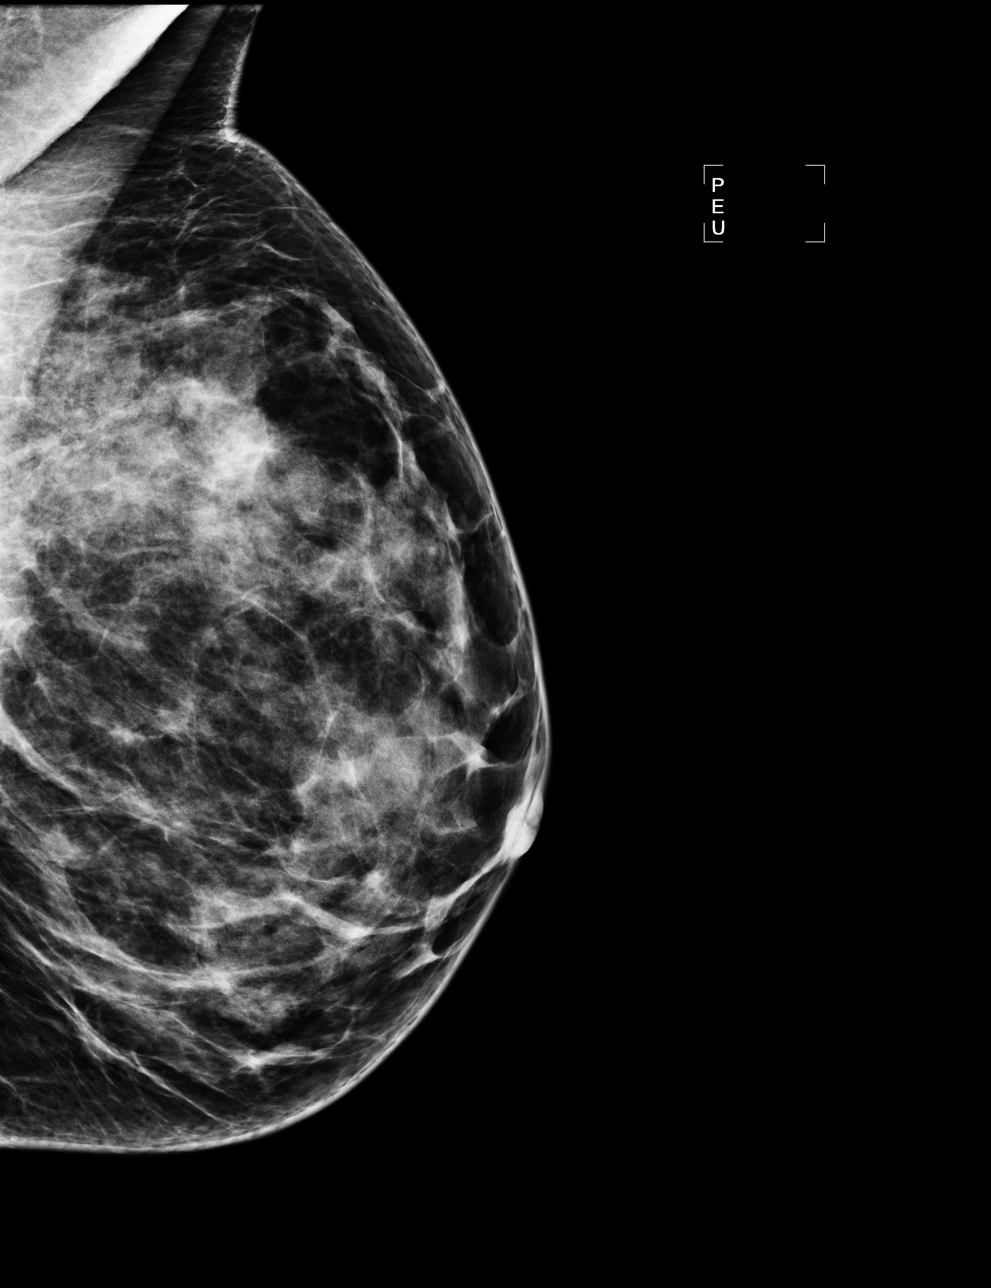

[R MLO]
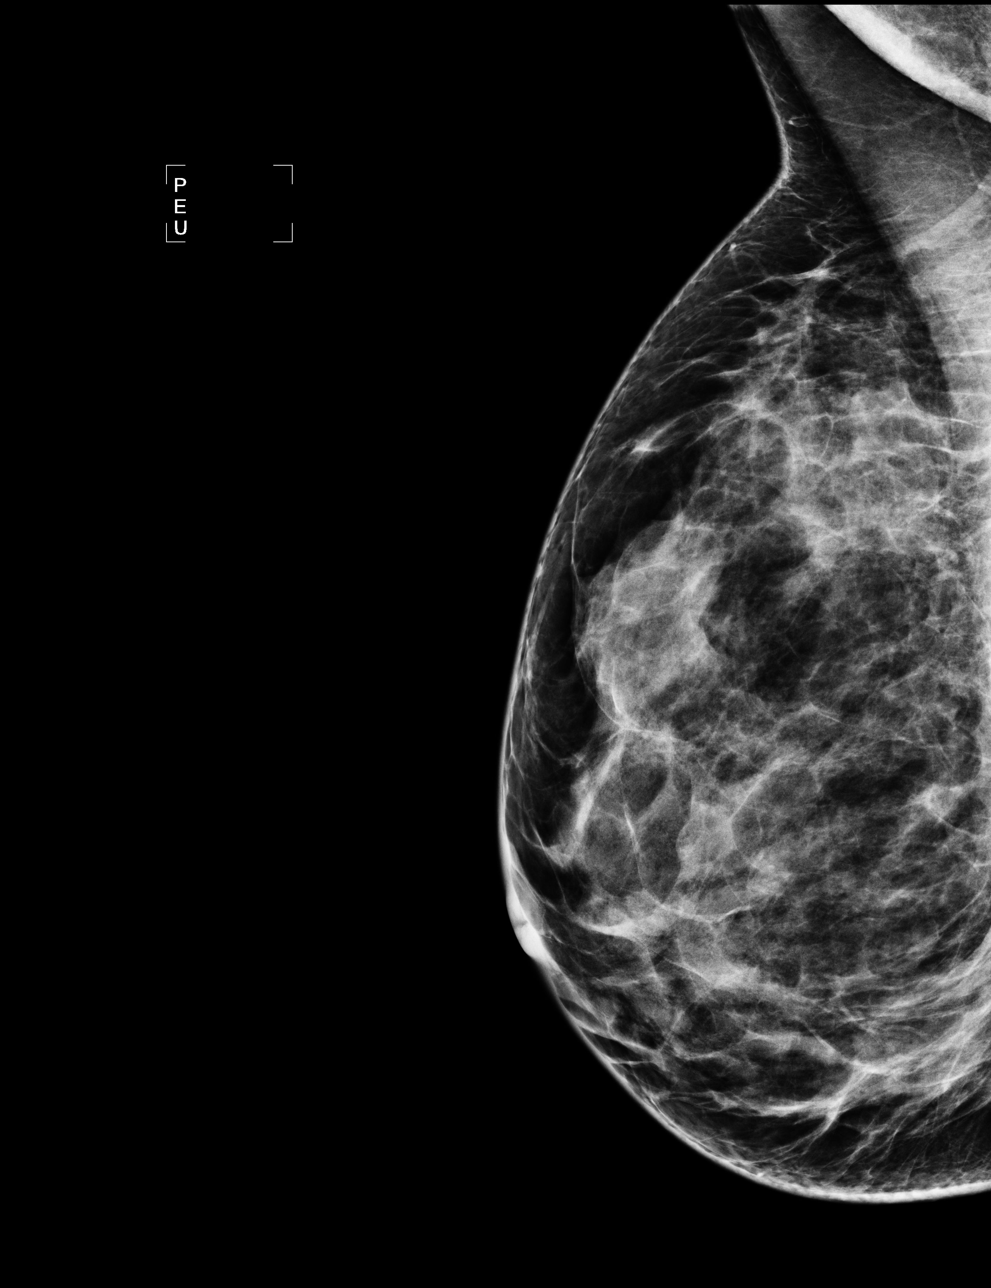

[4 of 4 positions shown; findings below may reference images not displayed]

IMPRESSION: Possible asymmetry, left breast.  Additional evaluation is indicated.  The patient will be 
contacted for additional studies and a supplementary report will follow.  No specific mammographic 
evidence of malignancy, right breast.

ASSESSMENT: Need additional imaging evaluation and/or prior mammograms for comparison - BI-RADS 0

Further imaging of the left breast.
,

## 2012-02-03 ENCOUNTER — Other Ambulatory Visit: Payer: Self-pay | Admitting: Obstetrics and Gynecology

## 2012-02-03 DIAGNOSIS — R928 Other abnormal and inconclusive findings on diagnostic imaging of breast: Secondary | ICD-10-CM

## 2012-02-10 ENCOUNTER — Ambulatory Visit
Admission: RE | Admit: 2012-02-10 | Discharge: 2012-02-10 | Disposition: A | Discharge status: Home / Self Care | Payer: 59 | Source: Ambulatory Visit | Attending: Obstetrics and Gynecology | Admitting: Obstetrics and Gynecology

## 2012-02-10 DIAGNOSIS — R928 Other abnormal and inconclusive findings on diagnostic imaging of breast: Secondary | ICD-10-CM

## 2012-02-10 IMAGING — MG MM DIGITAL DIAGNOSTIC LIMITED*L*
3 series · 3 of 3 positions shown · non-contrast
Comparison: Prior studies

CLINICAL DATA: Screening callback for questioned left breast mass

DIGITAL DIAGNOSTIC LEFT MAMMOGRAM WITH CAD

[L MLO]
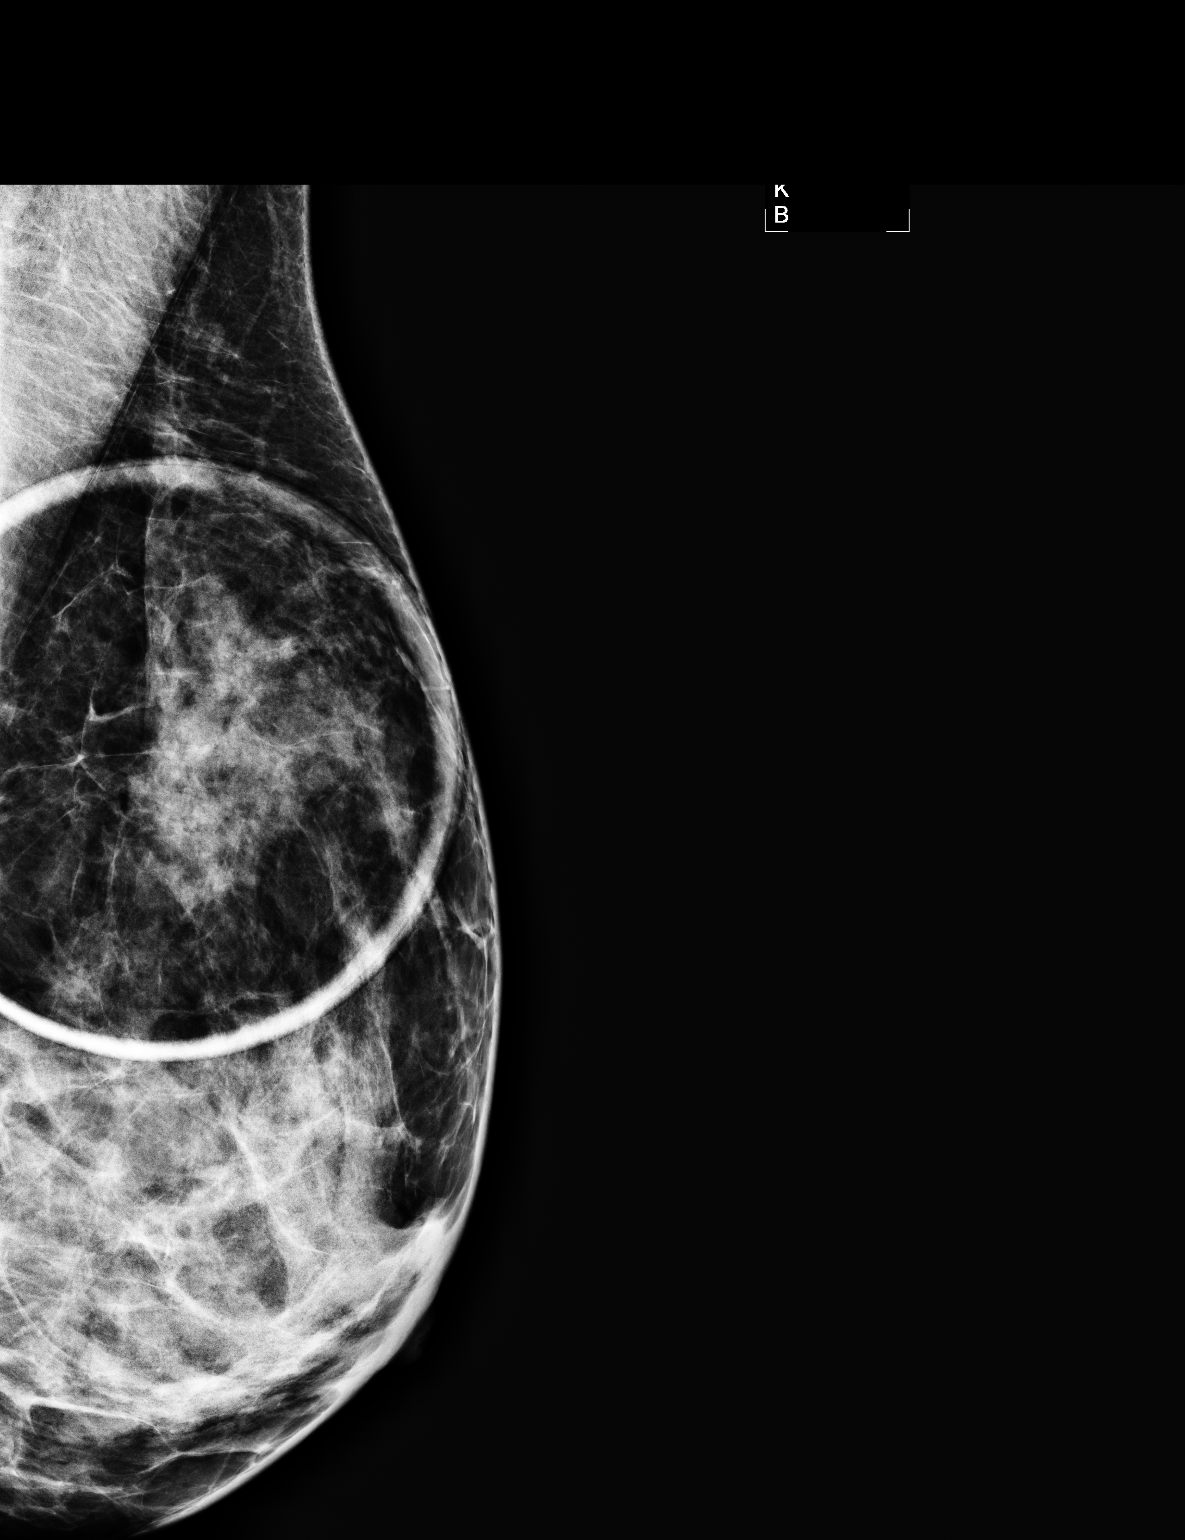

[L XCCL]
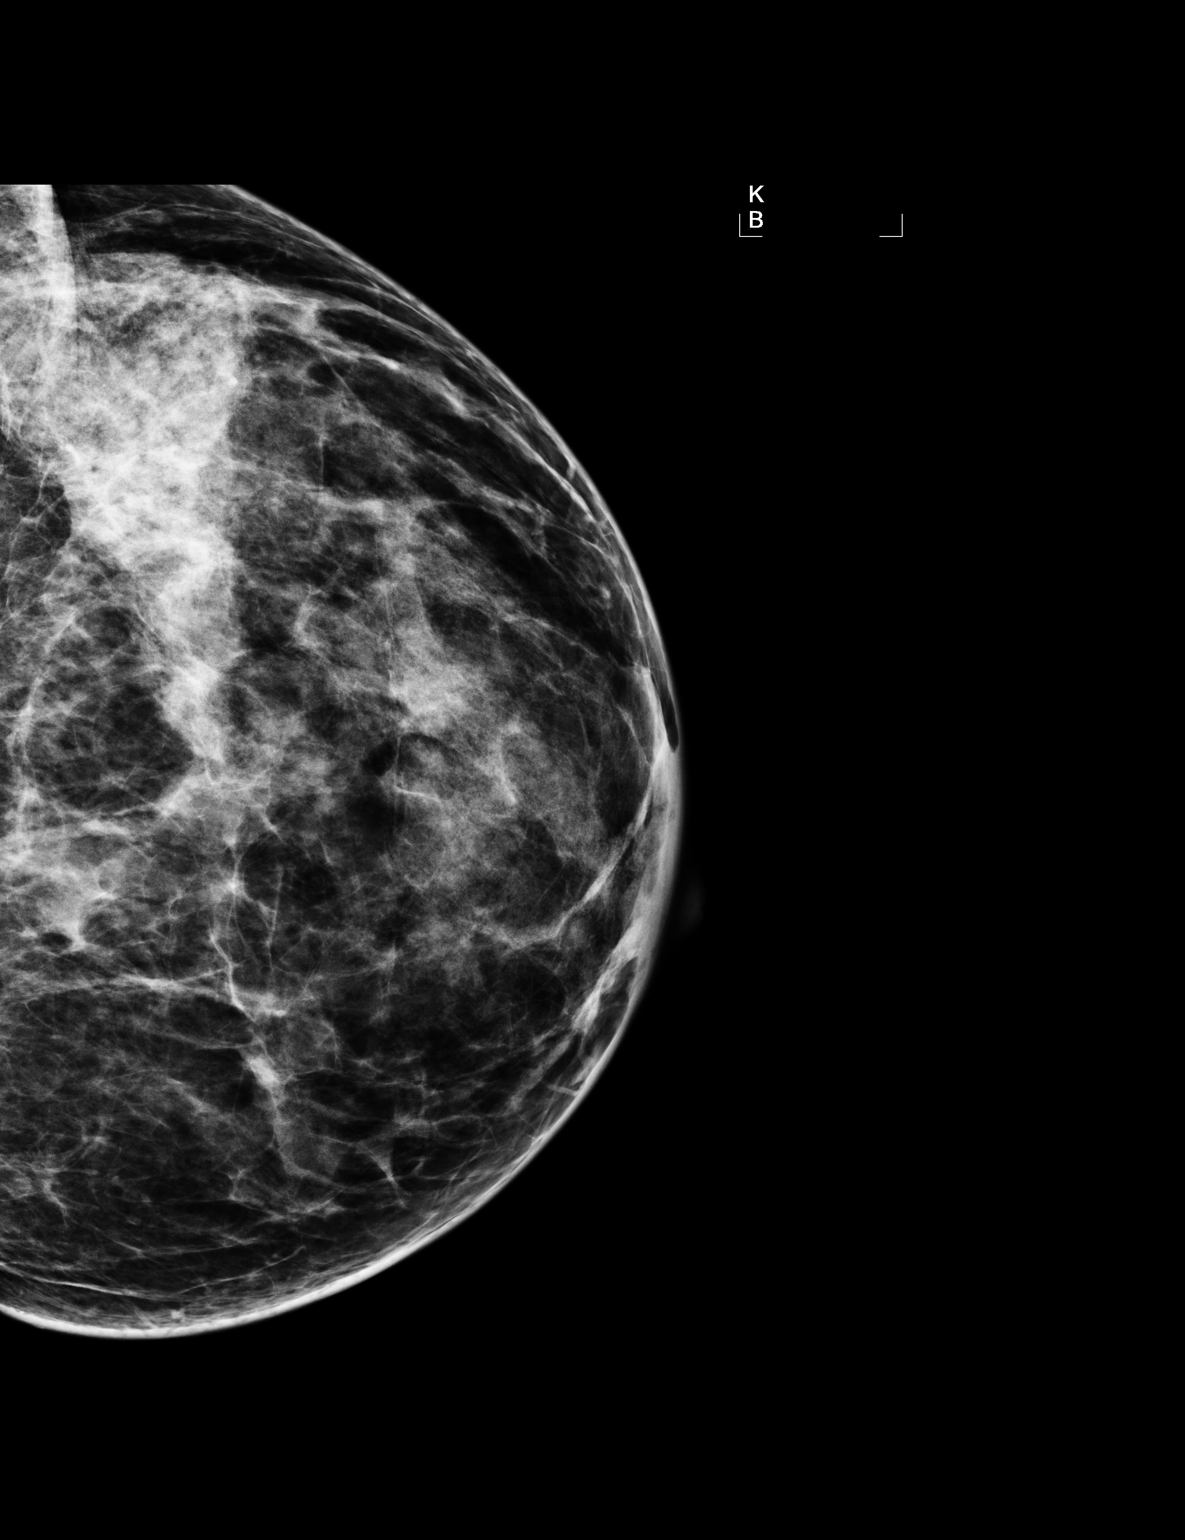

[L ML]
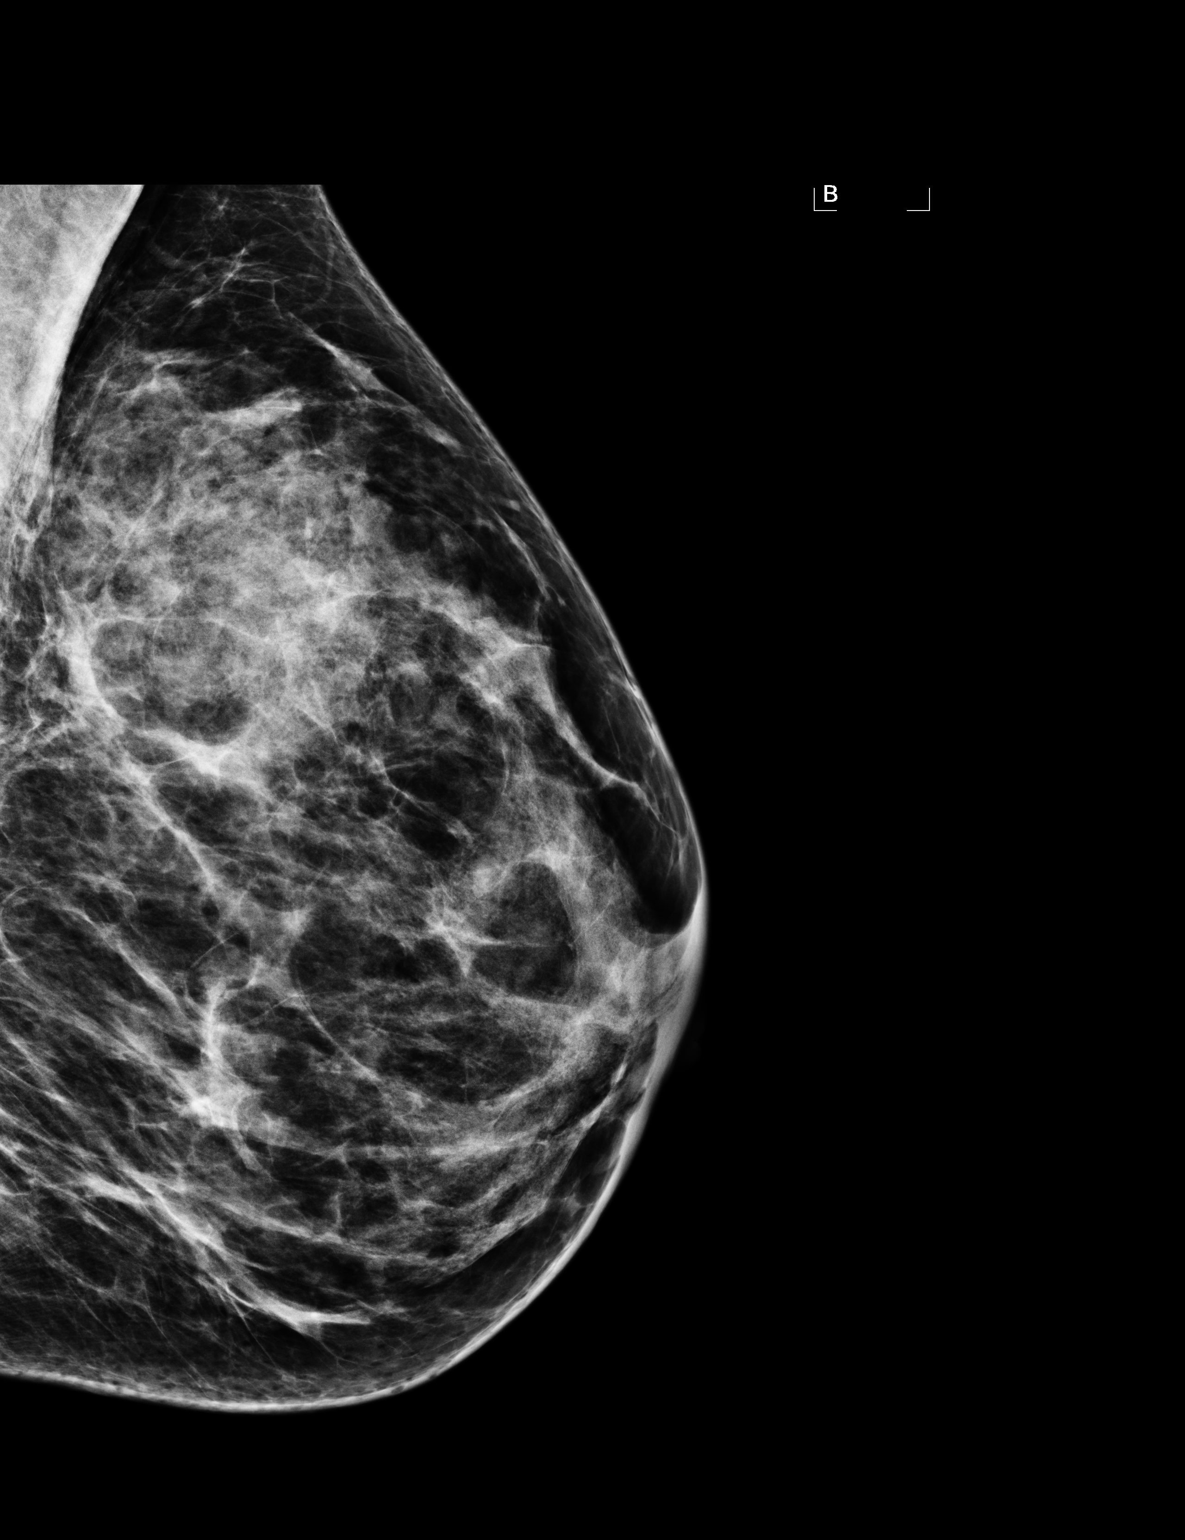

[3 of 3 positions shown; findings below may reference images not displayed]

FINDINGS: The area in question in the left breast is not
reproduced on additional imaging.  The area changes conformation
and demonstrates pliability, compatible with normal-appearing
fibroglandular tissue. No worrisome finding is seen.

Mammographic images were processed with CAD.
IMPRESSION: No evidence for malignancy in the left breast. Screening
mammography is recommended in one year. Findings and
recommendations discussed with the patient and provided in written
form at the time of the exam.

BI-RADS CATEGORY 1:  Negative.

## 2012-03-10 ENCOUNTER — Encounter: Payer: Self-pay | Admitting: Obstetrics and Gynecology

## 2012-03-16 ENCOUNTER — Encounter: Payer: Self-pay | Admitting: Obstetrics and Gynecology

## 2012-08-15 ENCOUNTER — Telehealth: Payer: Self-pay | Admitting: Obstetrics and Gynecology

## 2012-08-15 NOTE — Telephone Encounter (Signed)
Tc to pt per telephone call. Pt states", the last 2 cycles has been every 3 weeks". Pt ended most recent cycle last week(normal cycle). Pt started spotting 08/12/12-present. Pt states,' this doesn't seem like a cycle". No fever. Informed pt of vph recs of [facility] from 01/2012. Will consult with vph if pt needs re eval or sched for [facility]. Pt voices understanding.

## 2012-08-15 NOTE — Telephone Encounter (Signed)
[facility] as recommended. Please schedule.  Give pt cytotec to be used prior to [facility].

## 2012-08-16 ENCOUNTER — Other Ambulatory Visit: Payer: Self-pay

## 2012-08-16 DIAGNOSIS — N926 Irregular menstruation, unspecified: Secondary | ICD-10-CM

## 2012-08-16 MED ORDER — MISOPROSTOL 200 MCG PO TABS: ORAL_TABLET | ORAL | Status: DC

## 2012-08-16 NOTE — Telephone Encounter (Signed)
Lm on vm to cb per vph recs.  

## 2012-08-16 NOTE — Telephone Encounter (Signed)
Tc from pt per telephone call. Appt sched 10/05/12@3 :00p with vph for sonohysterogram due to pt returning from out of town. Information rgdg sonohysterogram mailed to pt. Pt voices understanding. Cytotec mailed to pt with instructions.

## 2012-09-29 DIAGNOSIS — D219 Benign neoplasm of connective and other soft tissue, unspecified: Secondary | ICD-10-CM

## 2012-09-29 DIAGNOSIS — D72829 Elevated white blood cell count, unspecified: Secondary | ICD-10-CM

## 2012-09-29 DIAGNOSIS — D259 Leiomyoma of uterus, unspecified: Secondary | ICD-10-CM | POA: Insufficient documentation

## 2012-09-29 DIAGNOSIS — Z86010 Personal history of colonic polyps: Secondary | ICD-10-CM

## 2012-09-29 DIAGNOSIS — F419 Anxiety disorder, unspecified: Secondary | ICD-10-CM

## 2012-10-03 ENCOUNTER — Telehealth: Payer: Self-pay | Admitting: Obstetrics and Gynecology

## 2012-10-03 DIAGNOSIS — N926 Irregular menstruation, unspecified: Secondary | ICD-10-CM

## 2012-10-03 NOTE — Telephone Encounter (Signed)
vph pt 

## 2012-10-04 NOTE — Telephone Encounter (Signed)
Tc to pt per telephone call. Pt opts not to have [facility] sched 10/05/12 with vph. Pt states,"cycle was normal on 09/20/12 x 4-5 days". Pt wants to have hormonal testing at this time to see if she is perimenopausal. Pt will cb to sched [facility] if cycles become irregular again. Will consult with vph for hormonal testing. Pt voices understanding.

## 2012-10-05 ENCOUNTER — Encounter: Payer: Self-pay | Admitting: Obstetrics and Gynecology

## 2012-10-07 NOTE — Telephone Encounter (Signed)
There is no reliable blood testing that will determine whether the patient is perimenopausal.  This diagnosis is made based on history and excluding other reasons for irregular menses.  The patient is certainly in the age range where she couldn't be perimenopausal but would only be treated in her symptoms warranted that.  Thyroid testing would be a good idea with this history of irregular.  She can come in for a TSH, free T4, and T3.  She should make her appointment for followup annual examination at which time a complete history we'll be obtained.

## 2012-10-10 NOTE — Telephone Encounter (Signed)
Tc from pt. Told pt per vph recs. Pt agrees. Lab appt sched 10/18/12 for thyroid testings.

## 2012-10-10 NOTE — Telephone Encounter (Signed)
Lm on vm to cb per vph recs.  

## 2012-10-18 ENCOUNTER — Other Ambulatory Visit: Payer: 59

## 2012-10-18 DIAGNOSIS — N926 Irregular menstruation, unspecified: Secondary | ICD-10-CM

## 2012-10-18 LAB — T4, FREE: Free T4: 1.23 ng/dL (ref 0.80–1.80)

## 2012-10-18 LAB — T3, FREE: T3, Free: 3.2 pg/mL (ref 2.3–4.2)

## 2012-10-18 LAB — TSH: TSH: 0.893 u[IU]/mL (ref 0.350–4.500)

## 2012-10-25 ENCOUNTER — Telehealth: Payer: Self-pay | Admitting: Obstetrics and Gynecology

## 2012-10-25 NOTE — Telephone Encounter (Signed)
Spoke with pt rgd lab results. Advised pt that her labs was wnl. Pt's voice understanding. bt cma

## 2012-12-29 ENCOUNTER — Other Ambulatory Visit: Payer: Self-pay | Admitting: Obstetrics and Gynecology

## 2012-12-29 DIAGNOSIS — Z1231 Encounter for screening mammogram for malignant neoplasm of breast: Secondary | ICD-10-CM

## 2013-02-06 ENCOUNTER — Ambulatory Visit: Payer: 59 | Admitting: Obstetrics and Gynecology

## 2013-02-06 ENCOUNTER — Encounter: Payer: Self-pay | Admitting: Obstetrics and Gynecology

## 2013-02-06 VITALS — BP 104/60 | Ht 67.5 in | Wt 144.0 lb

## 2013-02-06 DIAGNOSIS — N926 Irregular menstruation, unspecified: Secondary | ICD-10-CM | POA: Insufficient documentation

## 2013-02-06 DIAGNOSIS — F419 Anxiety disorder, unspecified: Secondary | ICD-10-CM

## 2013-02-06 DIAGNOSIS — D219 Benign neoplasm of connective and other soft tissue, unspecified: Secondary | ICD-10-CM

## 2013-02-06 NOTE — Progress Notes (Signed)
ANNUAL:  Last Pap: 02/01/12 HPV not detected WNL: Yes Regular Periods:yes Contraception: Condoms  Monthly Breast exam:yes Tetanus<108yrs:yes Nl.Bladder Function:yes Daily BMs:yes Healthy Diet:yes Calcium:no Mammogram:yes Date of Mammogram: 02/10/2012 Exercise:yes Have often Exercise: 3 times weekly Seatbelt: yes Abuse at home: no Stressful work:no Sigmoid-colonoscopy: 05/2008. Polyp removed Bone Density: No PCP: Laurann [place] Change in PMH: None Change in [facility]: None  Subjective:    Margaret Franco is a 47 y.o. female, G0P0000, who presents for an annual exam.     History   Social History  . Marital Status: Married    Spouse Name: N/A    Number of Children: N/A  . Years of Education: N/A   Social History Main Topics  . Smoking status: Never Smoker   . Smokeless tobacco: Never Used  . Alcohol Use: No  . Drug Use: No  . Sexually Active: None   Other Topics Concern  . None   Social History Narrative  . None    Menstrual cycle:   LMP: No LMP recorded.           Cycle: menses now q 24-30 days.  No further IM bleeding  The following portions of the patient's history were reviewed and updated as appropriate: allergies, current medications, past family history, past medical history, past social history, past surgical history and problem list.  Review of Systems Pertinent items are noted in HPI. Breast:Negative for breast lump,nipple discharge or nipple retraction Gastrointestinal: Negative for abdominal pain, change in bowel habits or rectal bleeding Urinary:negative   Objective:    BP 104/60  Ht 5' 7.5" (1.715 m)  Wt 144 lb (65.318 kg)  BMI 22.21 kg/m2  LMP 01/16/2013    Weight:  Wt Readings from Last 1 Encounters:  02/06/13 144 lb (65.318 kg)          BMI: Body mass index is 22.21 kg/(m^2).  General Appearance: Alert, appropriate appearance for age. No acute distress HEENT: Grossly normal Neck / Thyroid: Supple, no masses, nodes or  enlargement Lungs: clear to auscultation bilaterally Back: No CVA tenderness Breast Exam: No masses or nodes.No dimpling, nipple retraction or discharge. Cardiovascular: Regular rate and rhythm. S1, S2, no murmur Gastrointestinal: Soft, non-tender, no masses or organomegaly Pelvic Exam: Vulva and vagina appear normal. Bimanual exam reveals normal uterus and adnexa. Rectovaginal: normal rectal, no masses Lymphatic Exam: Non-palpable nodes in neck, clavicular, axillary, or inguinal regions Skin: no rash or abnormalities Neurologic: Normal gait and speech, no tremor  Psychiatric: Alert and oriented, appropriate affect.   Wet Prep:not applicable Urinalysis:not applicable UPT: Not done   Assessment:    Normal gyn exam Family hx colon cancer    Plan:    mammogram pap smear done 2013 with HR HPV NEG.  Repeat 2016 return annually or prn STD screening: declined Contraception:condoms   Dierdre Forth MD

## 2013-02-08 ENCOUNTER — Ambulatory Visit: Payer: 59

## 2013-02-23 ENCOUNTER — Ambulatory Visit
Admission: RE | Admit: 2013-02-23 | Discharge: 2013-02-23 | Disposition: A | Discharge status: Home / Self Care | Payer: PRIVATE HEALTH INSURANCE | Source: Ambulatory Visit | Attending: Obstetrics and Gynecology | Admitting: Obstetrics and Gynecology

## 2013-02-23 DIAGNOSIS — Z1231 Encounter for screening mammogram for malignant neoplasm of breast: Secondary | ICD-10-CM

## 2013-02-23 IMAGING — MG MM DIGITAL SCREENING BILAT
4 series · 4 of 4 positions shown · non-contrast
Comparison: Previous exams.

CLINICAL DATA: Screening.

DIGITAL BILATERAL SCREENING MAMMOGRAM WITH CAD

[R CC]
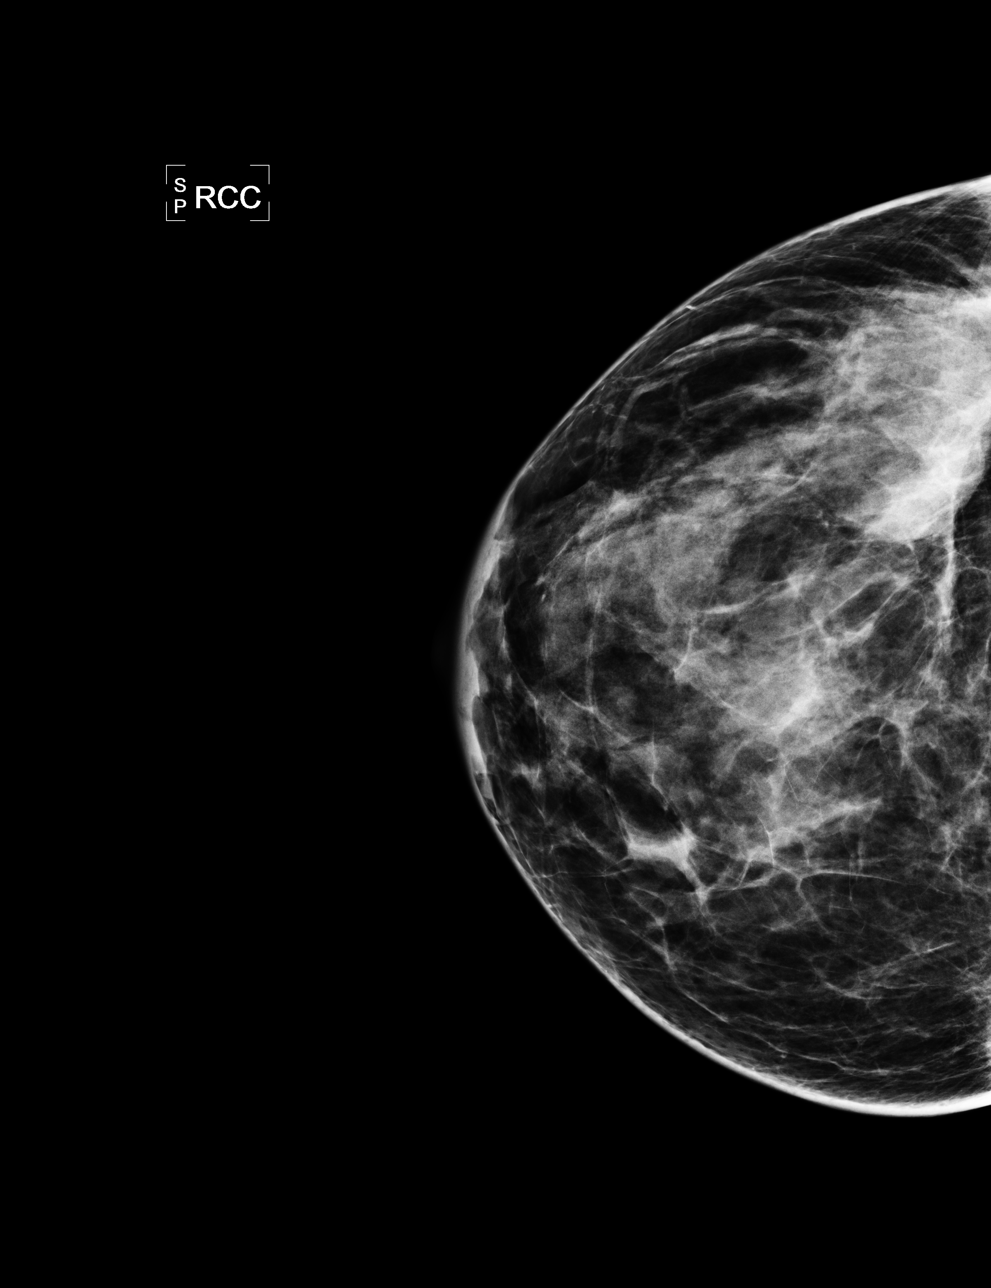

[L CC]
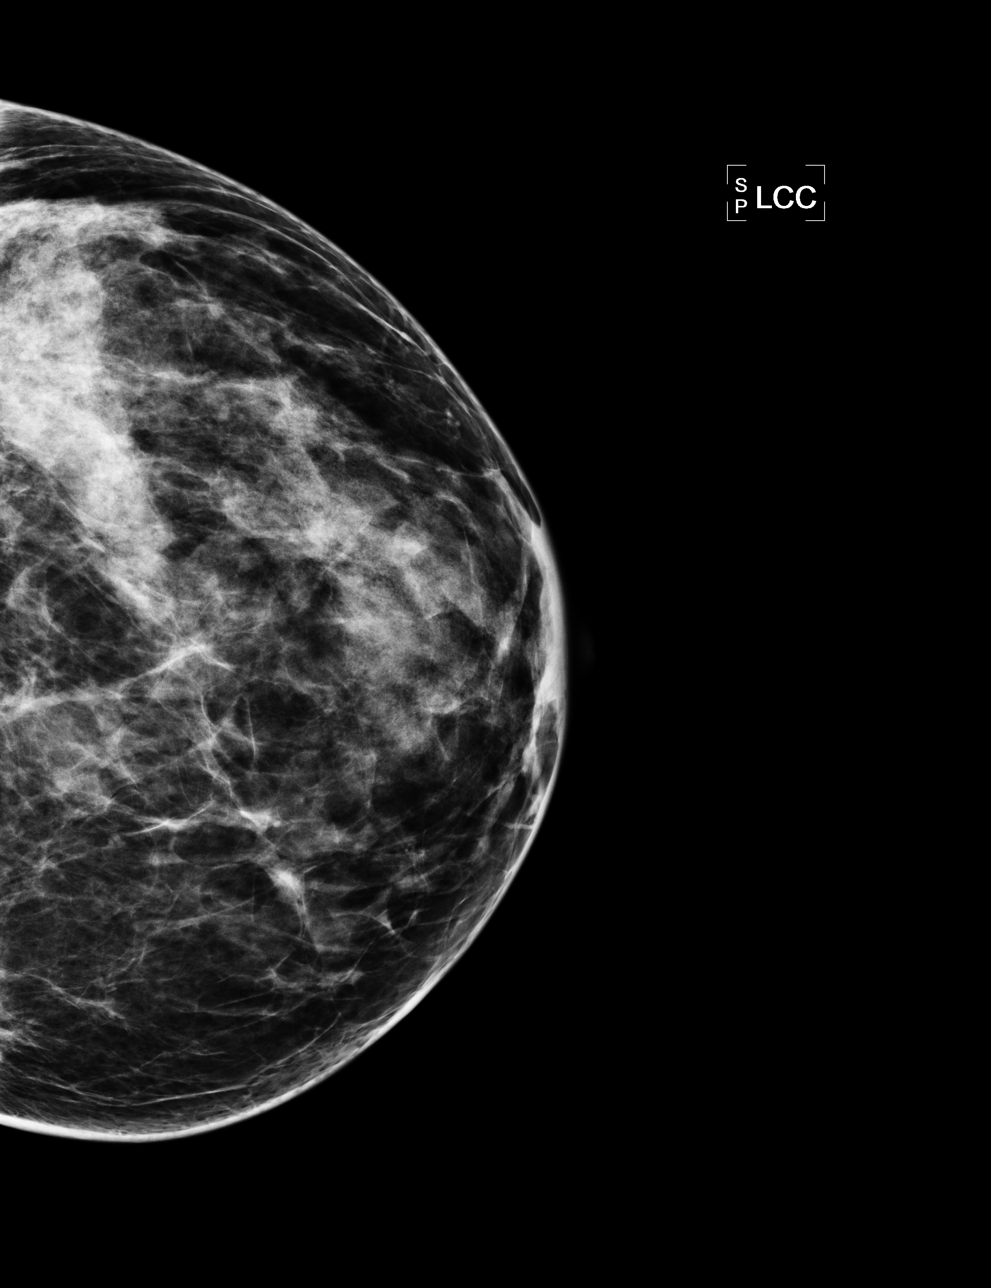

[L MLO]
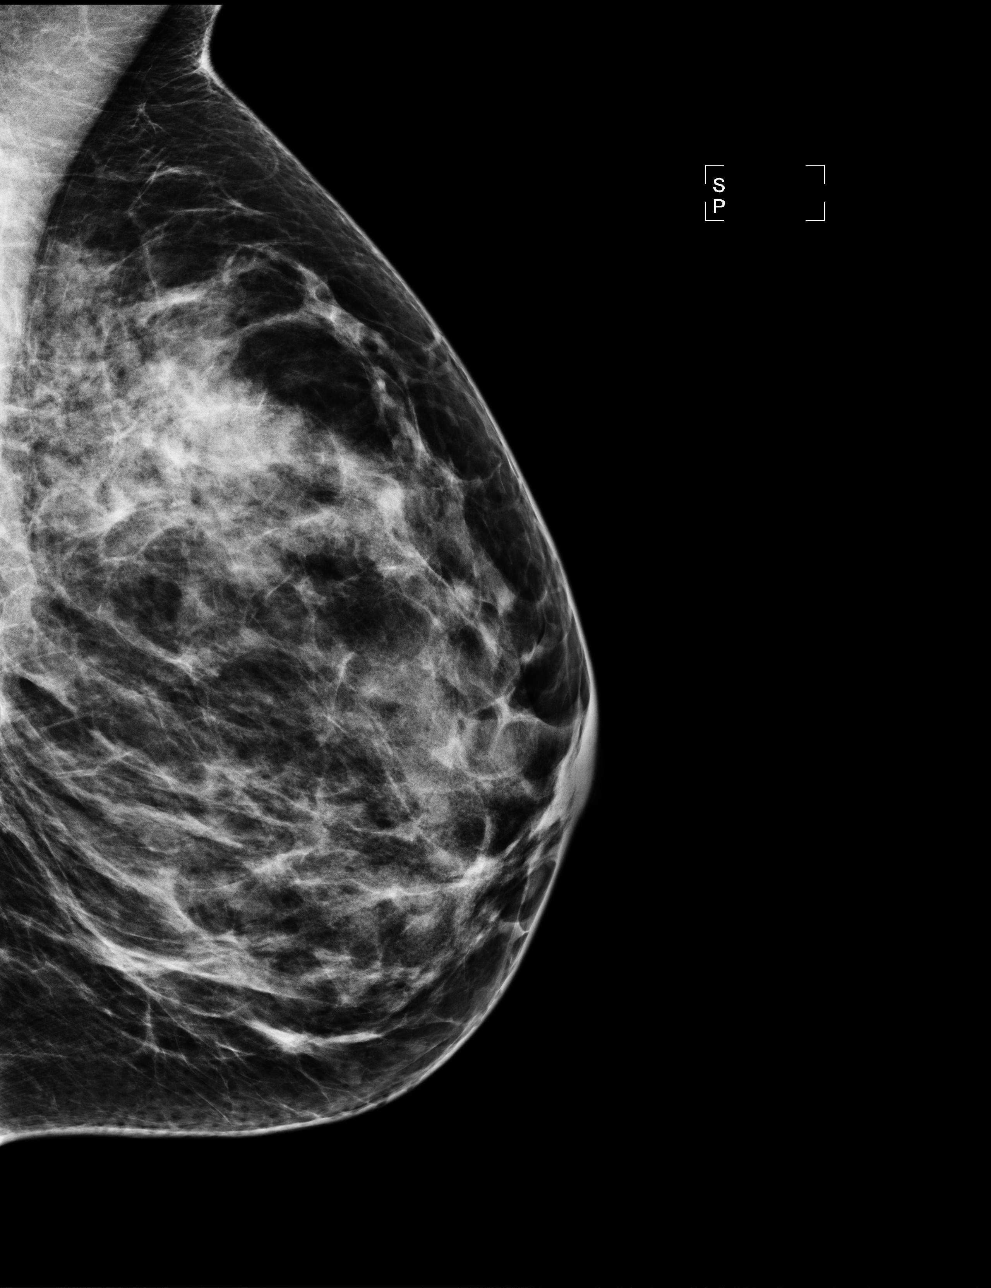

[R MLO]
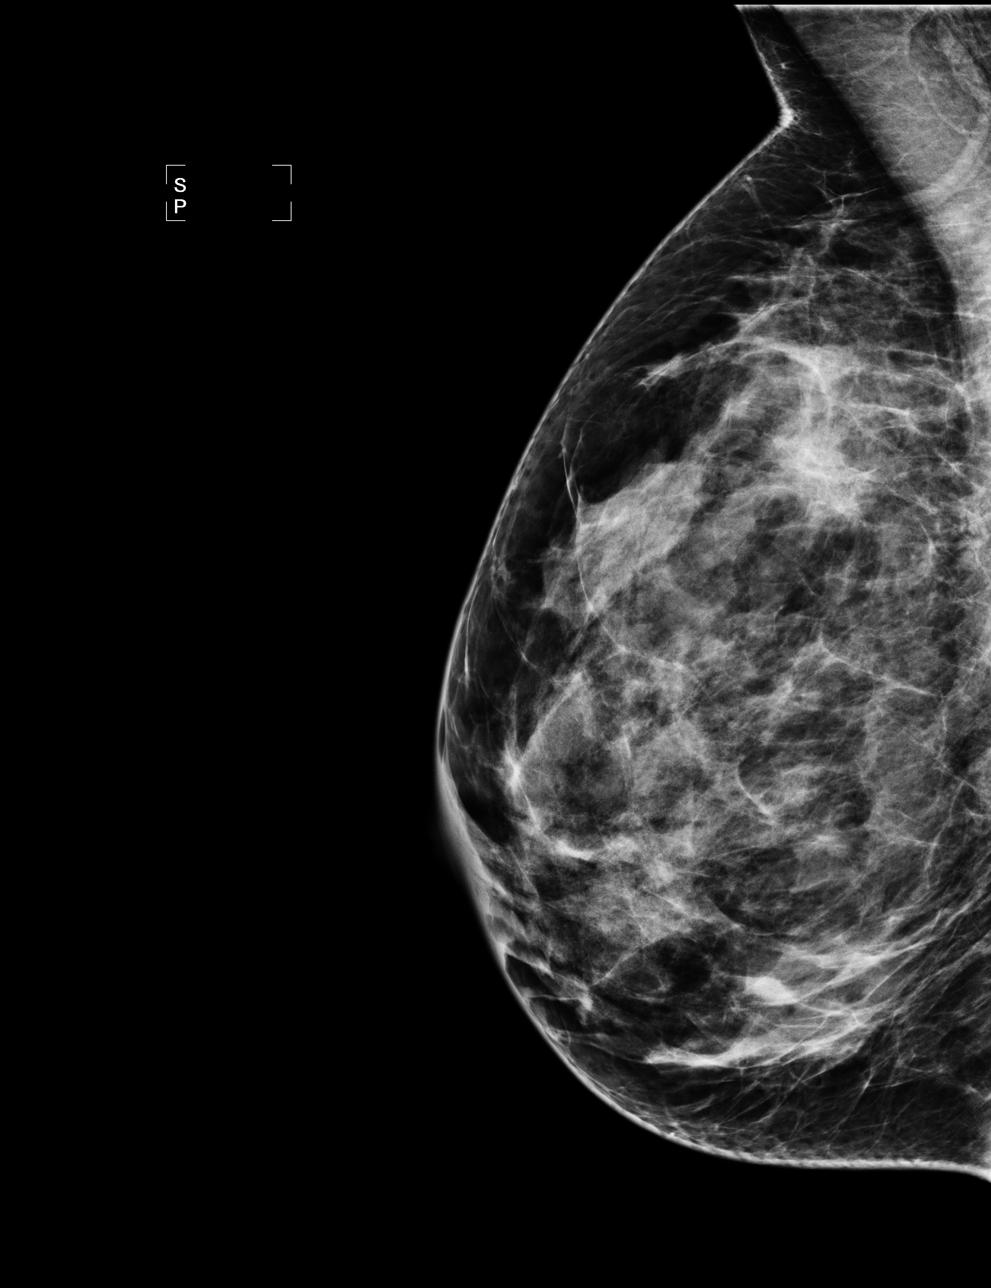

[4 of 4 positions shown; findings below may reference images not displayed]

FINDINGS: ACR Breast Density Category 3: The breast tissue is heterogeneously
dense.

No suspicious masses, architectural distortion, or calcifications
are present.

Images were processed with CAD.
IMPRESSION: No mammographic evidence of malignancy.

A result letter of this screening mammogram will be mailed directly
to the patient.

RECOMMENDATION:
Screening mammogram in one year. (Code:[BP])

BI-RADS CATEGORY 1:  Negative.

## 2013-02-26 ENCOUNTER — Encounter: Payer: Self-pay | Admitting: Obstetrics and Gynecology

## 2013-02-26 DIAGNOSIS — R922 Inconclusive mammogram: Secondary | ICD-10-CM | POA: Insufficient documentation

## 2013-03-30 ENCOUNTER — Other Ambulatory Visit: Payer: Self-pay | Admitting: Family Medicine

## 2013-03-30 DIAGNOSIS — R109 Unspecified abdominal pain: Secondary | ICD-10-CM

## 2013-04-03 ENCOUNTER — Other Ambulatory Visit: Payer: PRIVATE HEALTH INSURANCE

## 2013-04-05 ENCOUNTER — Ambulatory Visit
Admission: RE | Admit: 2013-04-05 | Discharge: 2013-04-05 | Disposition: A | Discharge status: Home / Self Care | Payer: PRIVATE HEALTH INSURANCE | Source: Ambulatory Visit | Attending: Family Medicine | Admitting: Family Medicine

## 2013-04-05 DIAGNOSIS — R109 Unspecified abdominal pain: Secondary | ICD-10-CM

## 2013-04-05 IMAGING — US US ABDOMEN LIMITED
1 series · 14 of 25 positions shown · non-contrast
Comparison: Ultrasound the abdomen of [DATE]

CLINICAL DATA: Chronic right upper quadrant abdominal pain

LIMITED ABDOMINAL ULTRASOUND - RIGHT UPPER QUADRANT

[Series 1: us abdomen limited · 0.16mm/px · 14 of 49 slices shown]
[im 1/49]
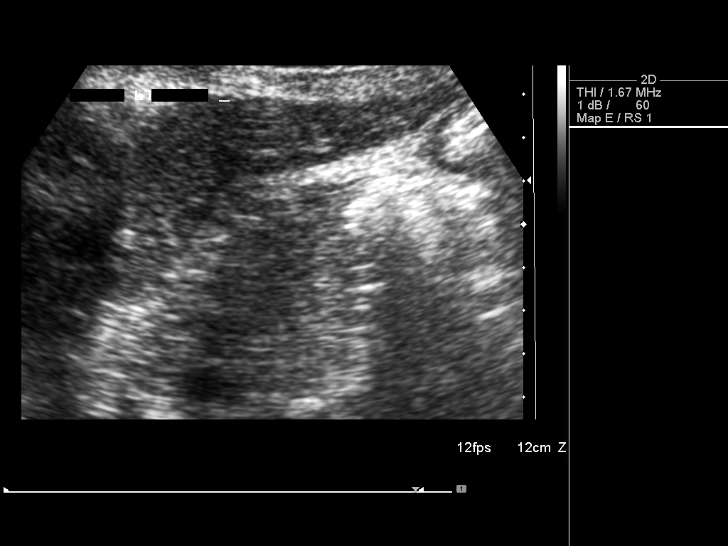
[im 5/49]
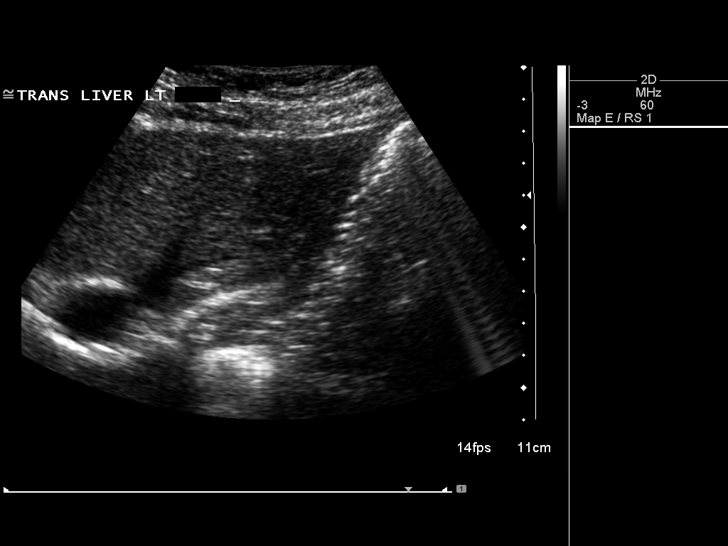
[im 9/49]
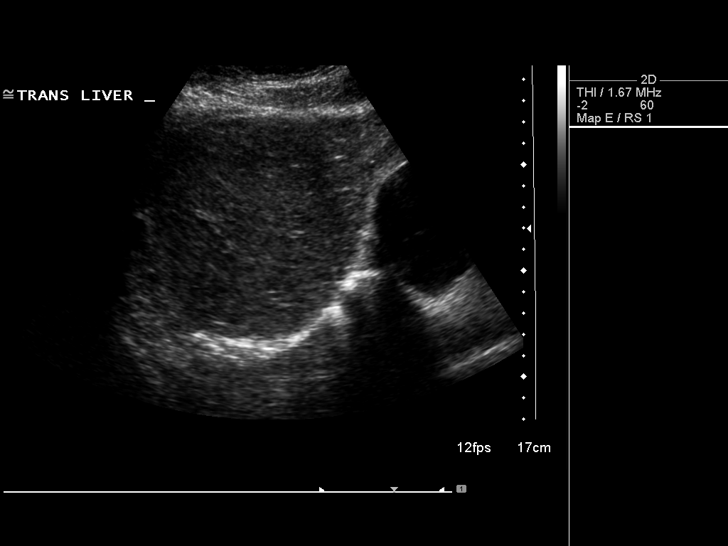
[im 13/49]
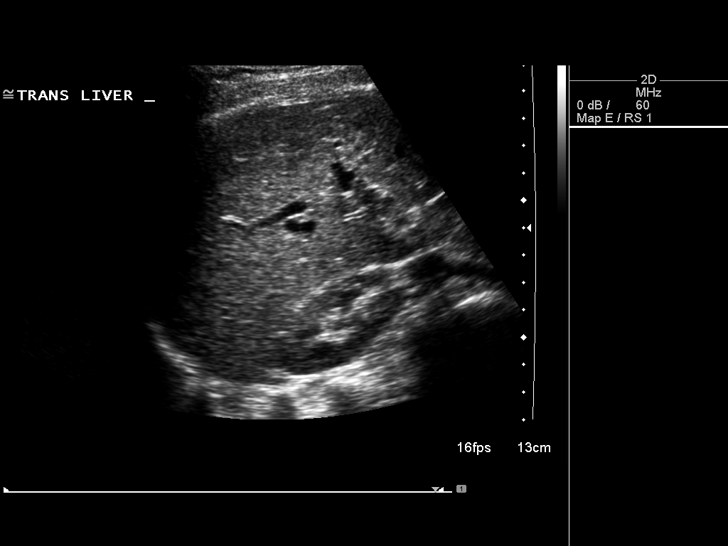
[im 17/49]
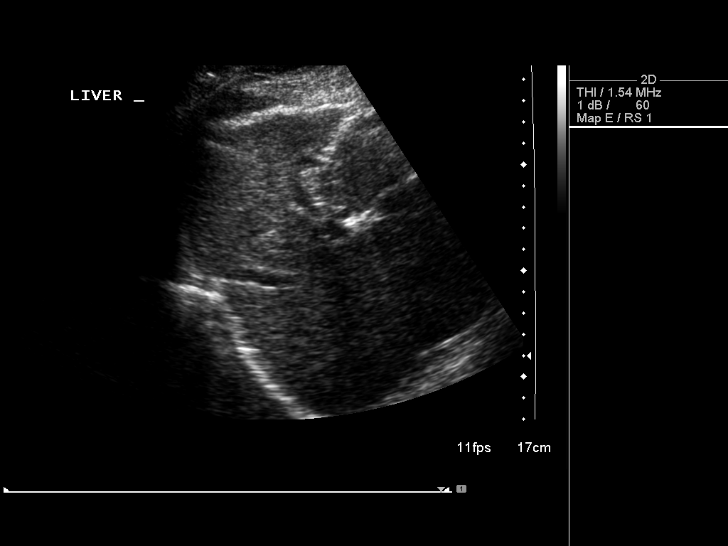
[im 19/49]
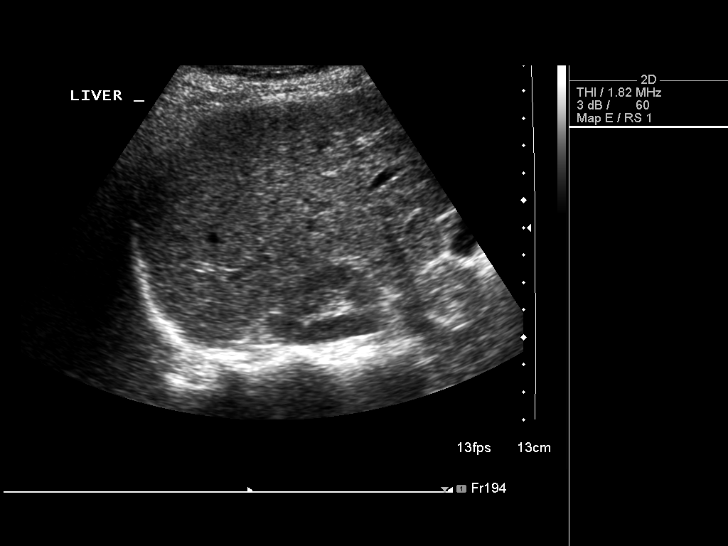
[im 23/49]
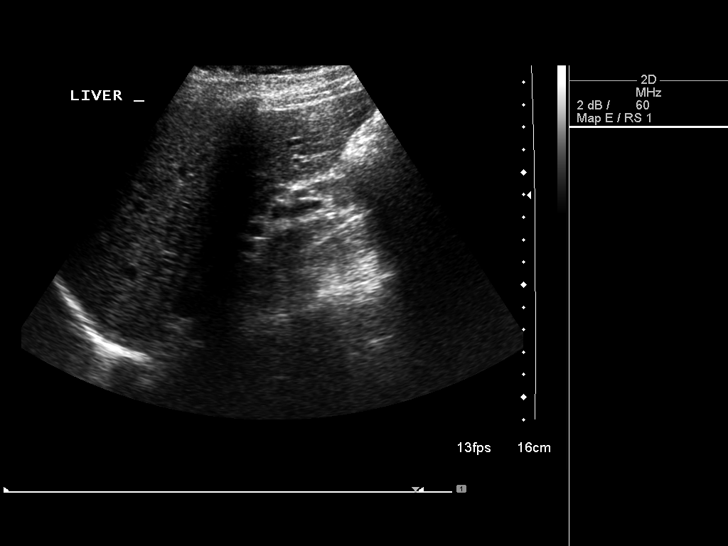
[im 27/49]
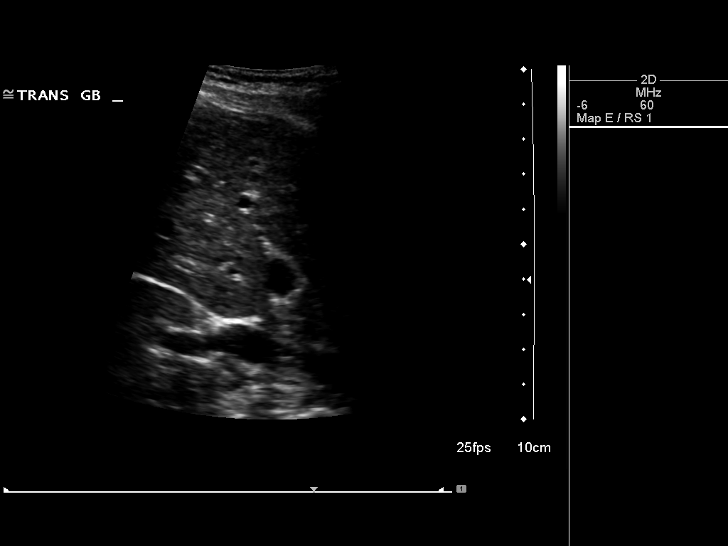
[im 31/49]
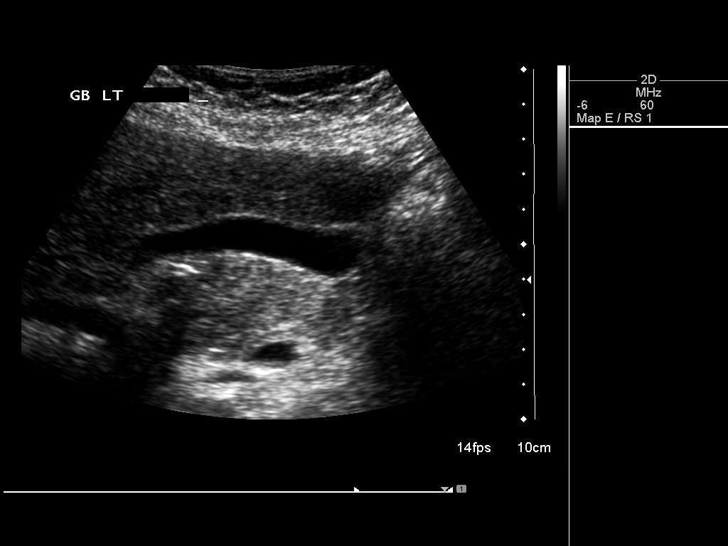
[im 33/49]
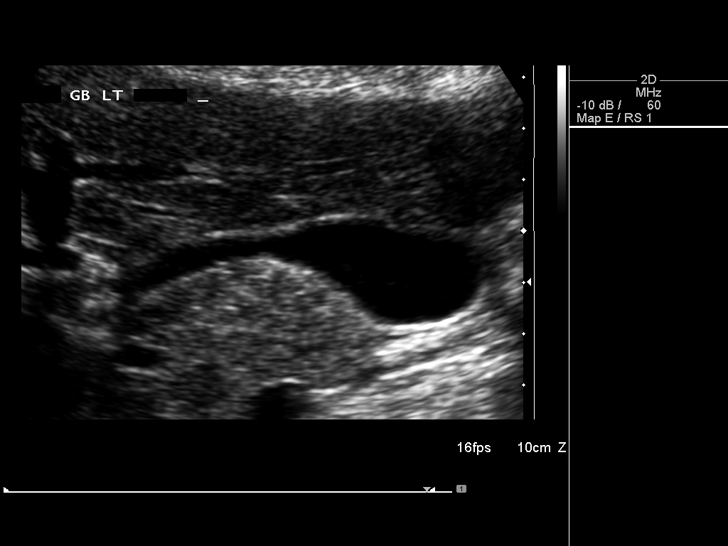
[im 37/49]
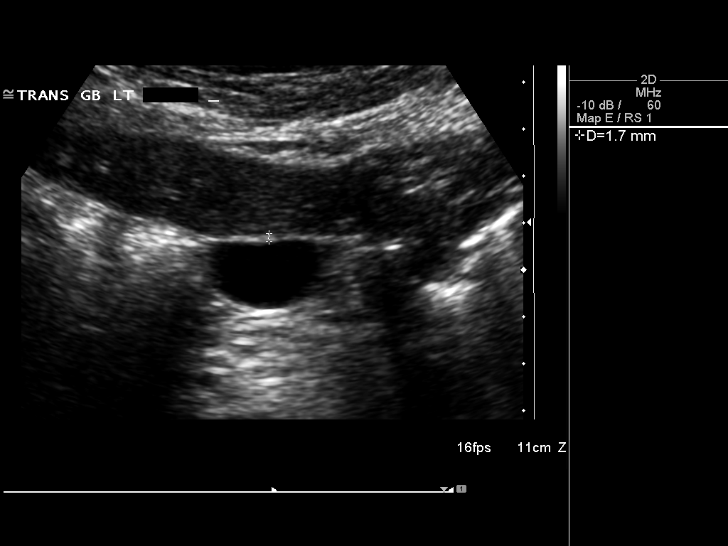
[im 41/49]
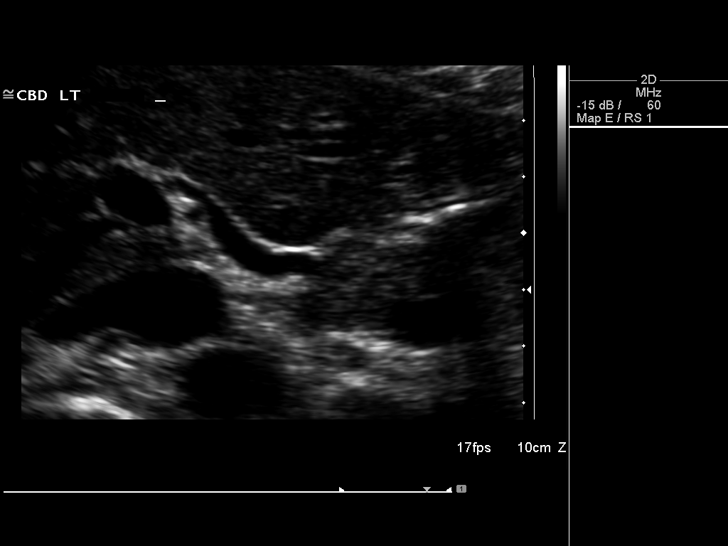
[im 45/49]
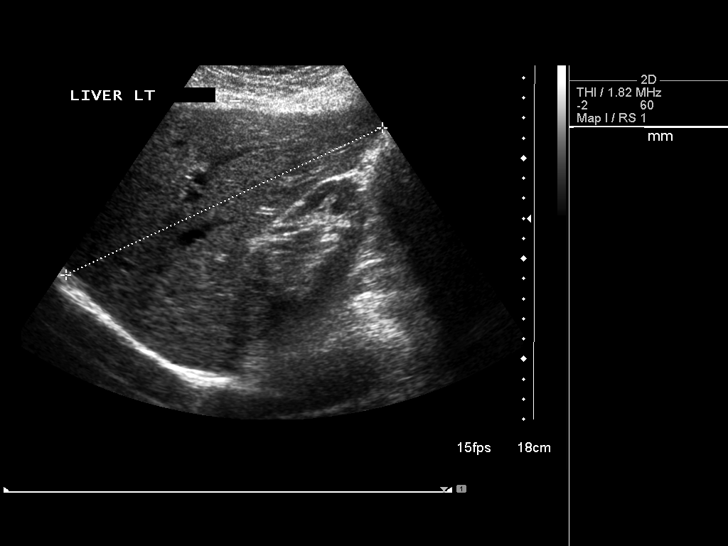
[im 49/49]
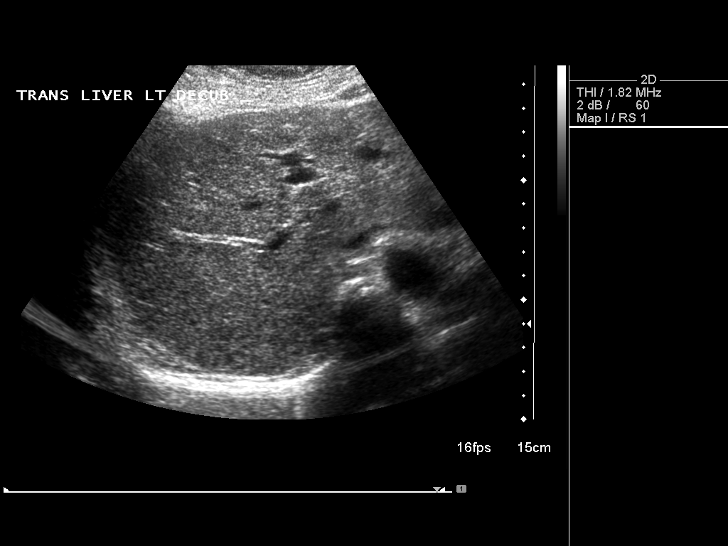

[14 of 25 positions shown; findings below may reference images not displayed]

FINDINGS: Gallbladder:  The gallbladder is visualized and no gallstones are
noted.  There is no pain over the gallbladder with compression.

Common bile duct:  The common bile duct is normal measuring 4.7 mm
in diameter.

Liver:  The liver has a normal echogenic pattern.  No focal
abnormality is seen.  The liver is however somewhat prominent
measuring 17.4 cm sagittally.
IMPRESSION: 1.  No gallstones.  No ductal dilatation.
2.  Borderline hepatomegaly.

## 2014-01-17 ENCOUNTER — Ambulatory Visit
Admission: RE | Admit: 2014-01-17 | Discharge: 2014-01-17 | Disposition: A | Discharge status: Home / Self Care | Payer: PRIVATE HEALTH INSURANCE | Source: Ambulatory Visit | Attending: Family Medicine | Admitting: Family Medicine

## 2014-01-17 ENCOUNTER — Other Ambulatory Visit: Payer: Self-pay | Admitting: Family Medicine

## 2014-01-17 DIAGNOSIS — R0602 Shortness of breath: Secondary | ICD-10-CM

## 2014-01-17 IMAGING — CR DG CHEST 2V
2 series · 2 of 2 positions shown · non-contrast
Comparison: No priors.

CLINICAL DATA: Cough, fever and shortness of breath.

EXAM:
CHEST  2 VIEW

[view not recorded (1 of 2)]
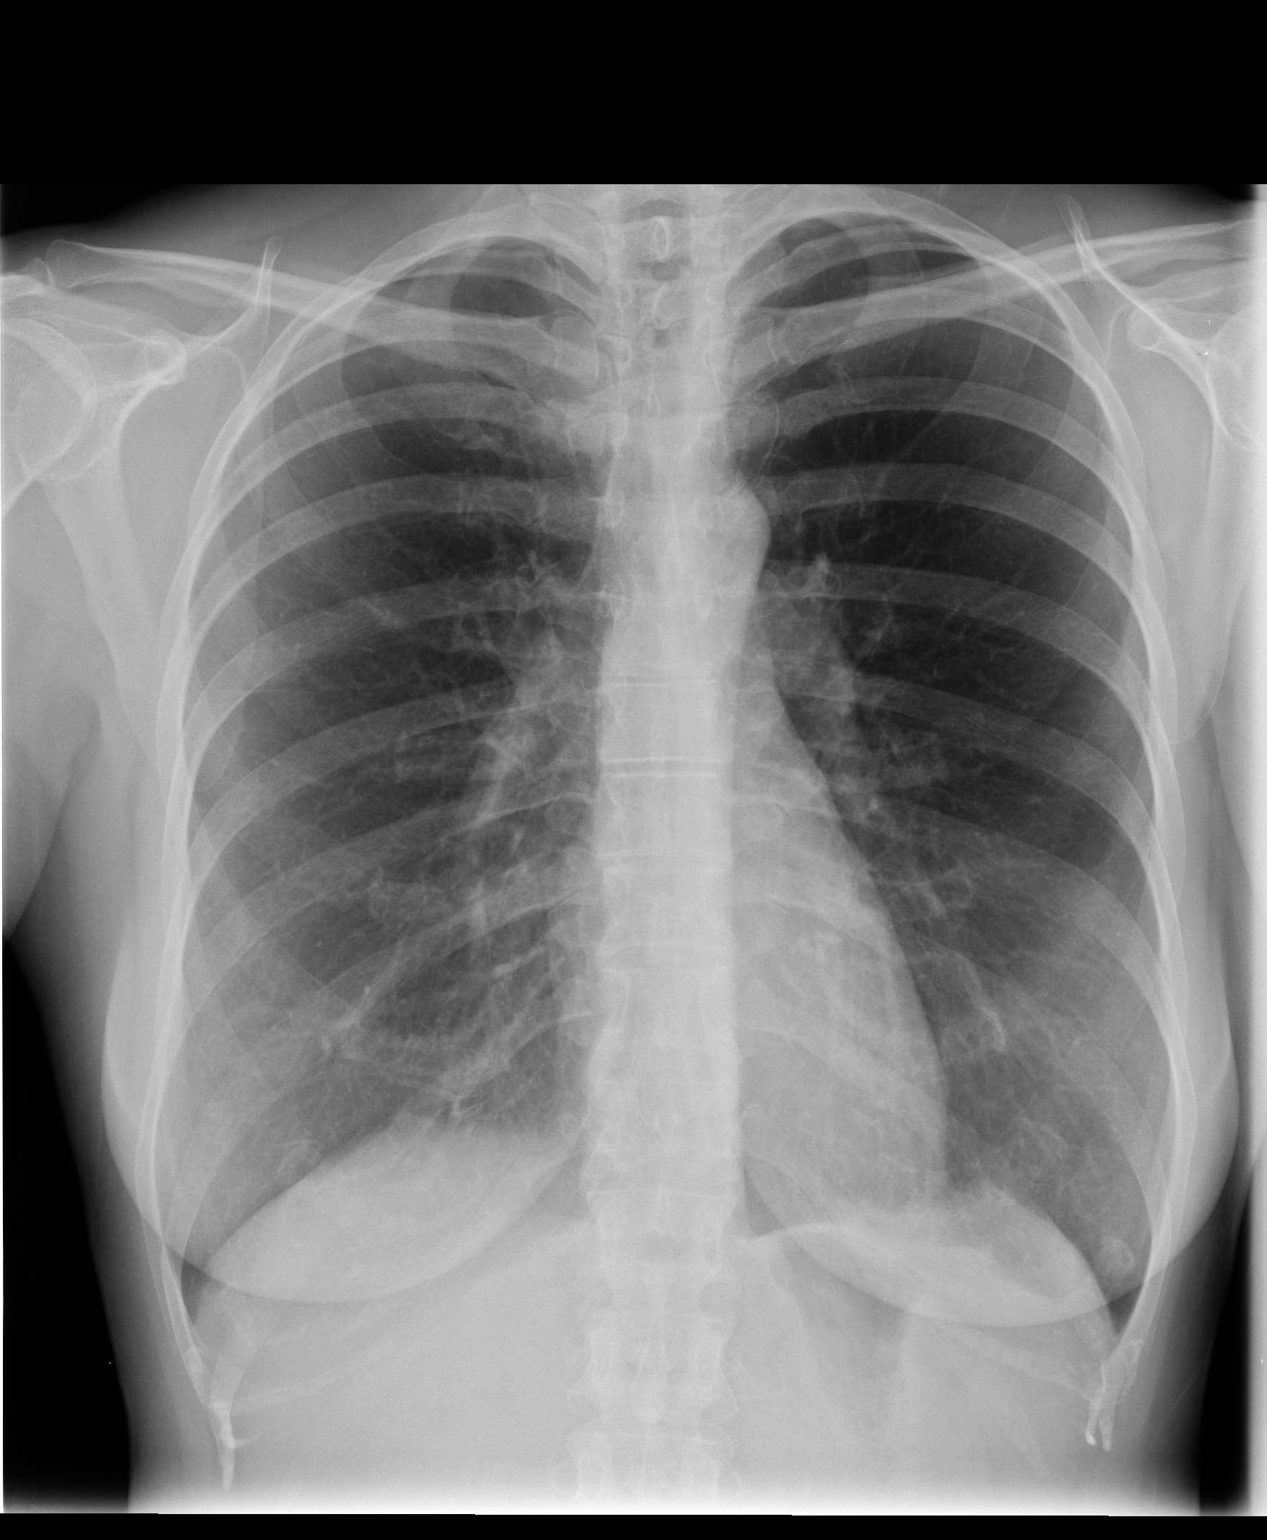

[view not recorded (2 of 2)]
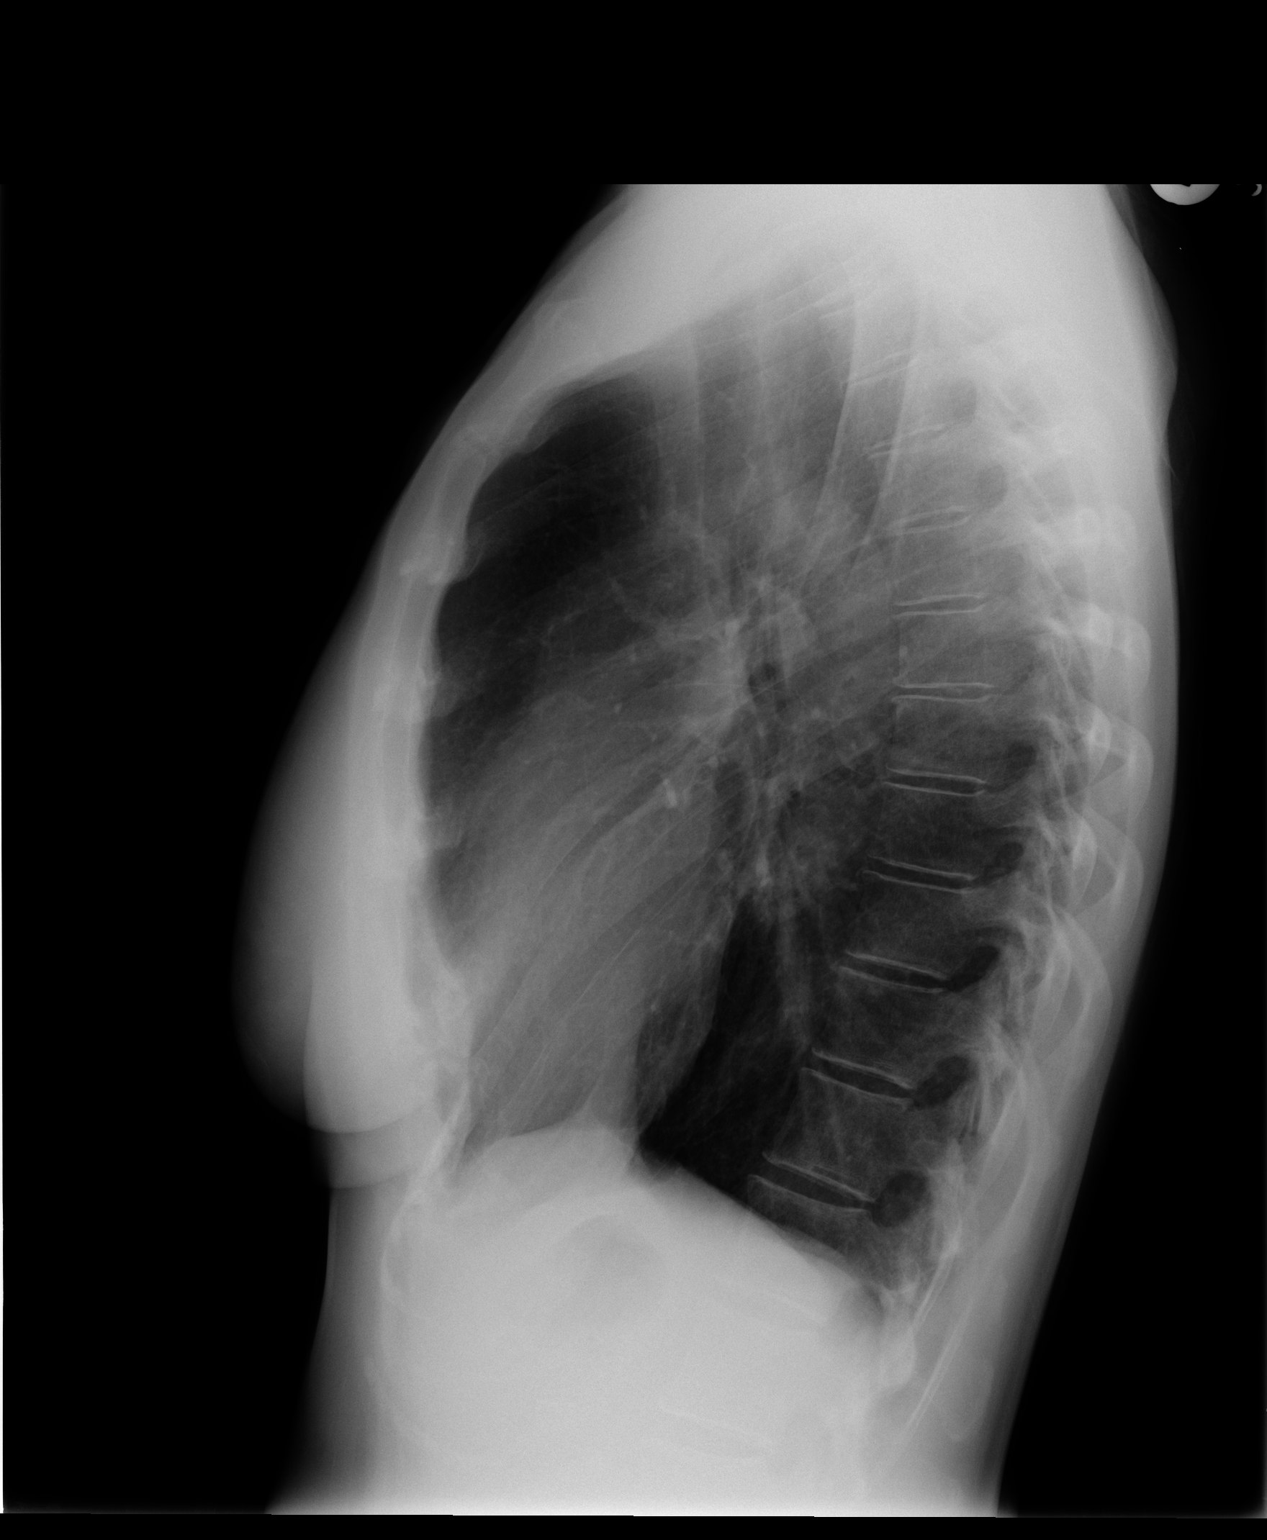

[2 of 2 positions shown; findings below may reference images not displayed]

FINDINGS: Lung volumes are normal. No consolidative airspace disease. No
pleural effusions. No pneumothorax. No pulmonary nodule or mass
noted. Pulmonary vasculature and the cardiomediastinal silhouette
are within normal limits. Prominent costochondral cartilage
calcifications.
IMPRESSION: 1.  No radiographic evidence of acute cardiopulmonary disease.

## 2014-03-26 ENCOUNTER — Other Ambulatory Visit: Payer: Self-pay

## 2014-03-26 DIAGNOSIS — Z1231 Encounter for screening mammogram for malignant neoplasm of breast: Secondary | ICD-10-CM

## 2014-04-13 ENCOUNTER — Ambulatory Visit
Admission: RE | Admit: 2014-04-13 | Discharge: 2014-04-13 | Disposition: A | Discharge status: Home / Self Care | Payer: PRIVATE HEALTH INSURANCE | Source: Ambulatory Visit

## 2014-04-13 ENCOUNTER — Encounter (INDEPENDENT_AMBULATORY_CARE_PROVIDER_SITE_OTHER): Payer: Self-pay

## 2014-04-13 DIAGNOSIS — Z1231 Encounter for screening mammogram for malignant neoplasm of breast: Secondary | ICD-10-CM

## 2014-05-10 ENCOUNTER — Ambulatory Visit: Payer: PRIVATE HEALTH INSURANCE | Admitting: Cardiology

## 2014-05-17 ENCOUNTER — Other Ambulatory Visit: Payer: Self-pay | Admitting: Family Medicine

## 2014-05-17 DIAGNOSIS — R160 Hepatomegaly, not elsewhere classified: Secondary | ICD-10-CM

## 2014-05-21 ENCOUNTER — Ambulatory Visit
Admission: RE | Admit: 2014-05-21 | Discharge: 2014-05-21 | Disposition: A | Discharge status: Home / Self Care | Payer: PRIVATE HEALTH INSURANCE | Source: Ambulatory Visit | Attending: Family Medicine | Admitting: Family Medicine

## 2014-05-21 DIAGNOSIS — R160 Hepatomegaly, not elsewhere classified: Secondary | ICD-10-CM

## 2014-05-21 IMAGING — US US ABDOMEN LIMITED
1 series · 14 of 25 positions shown · non-contrast
Comparison: [DATE]

CLINICAL DATA: Hepatomegaly

EXAM:
US ABDOMEN LIMITED - RIGHT UPPER QUADRANT

[Series 1: us abdomen limited · 0.24mm/px · 14 of 34 slices shown]
[im 1/34]
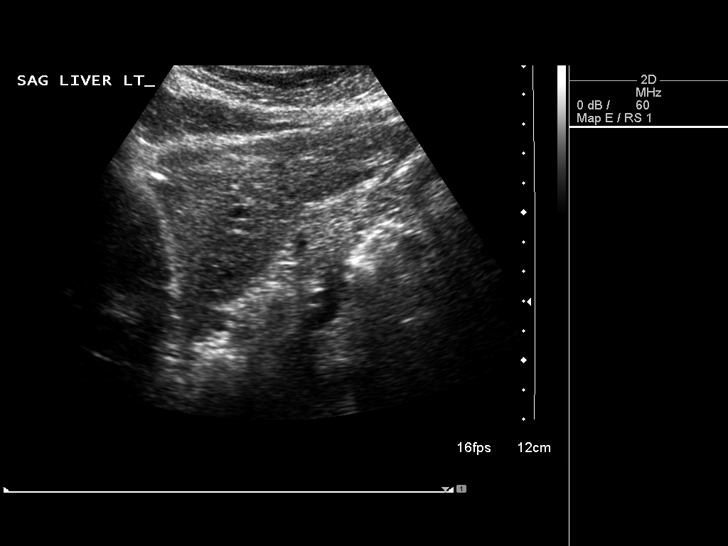
[im 3/34]
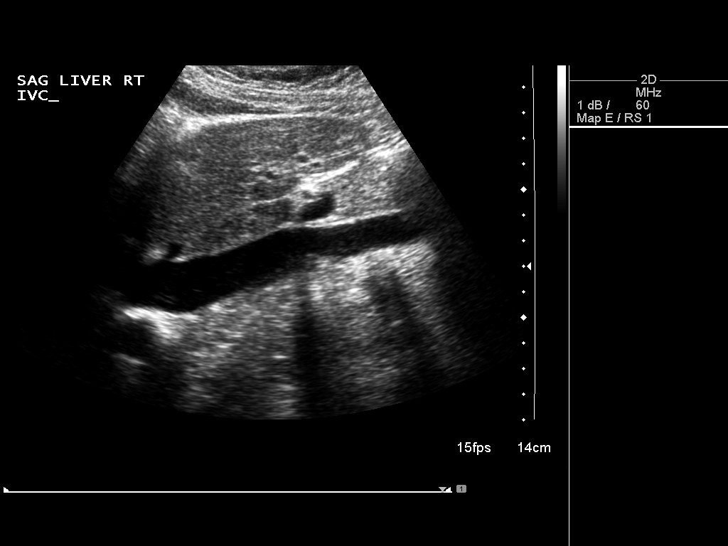
[im 6/34]
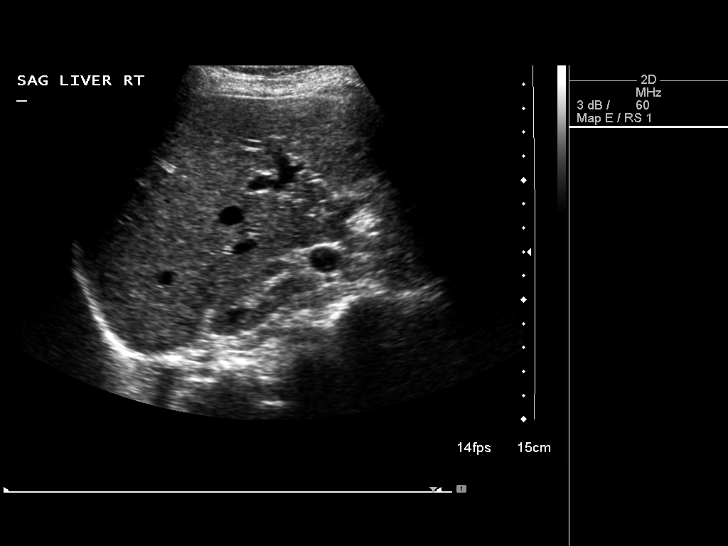
[im 9/34]
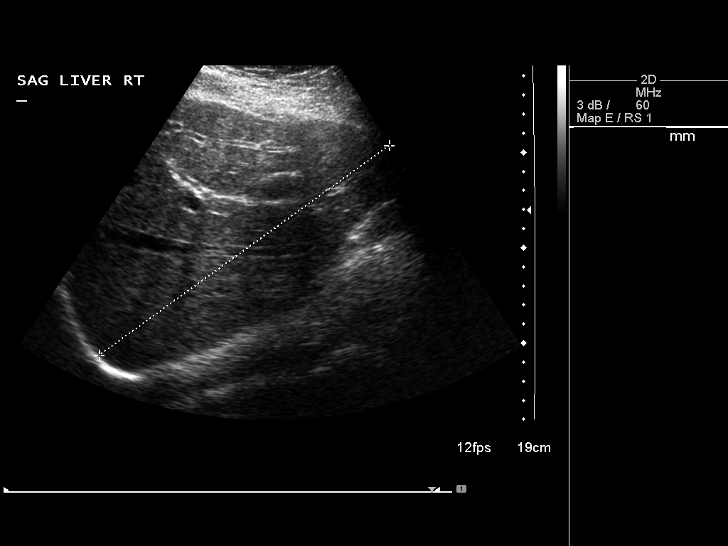
[im 12/34]
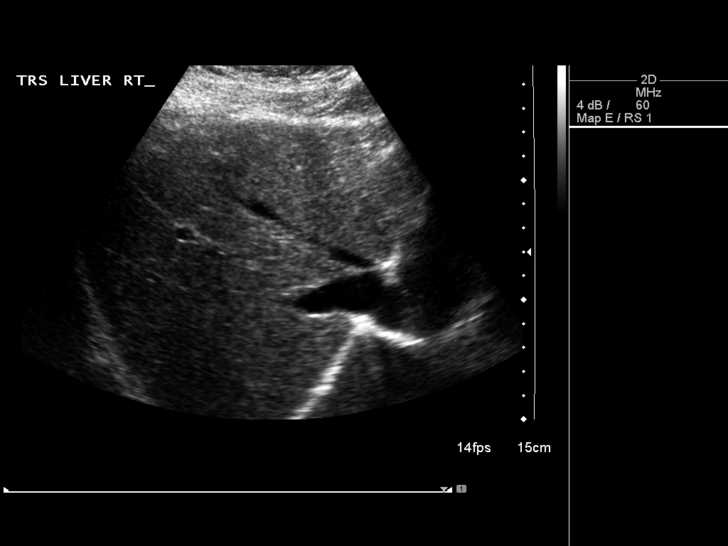
[im 13/34]
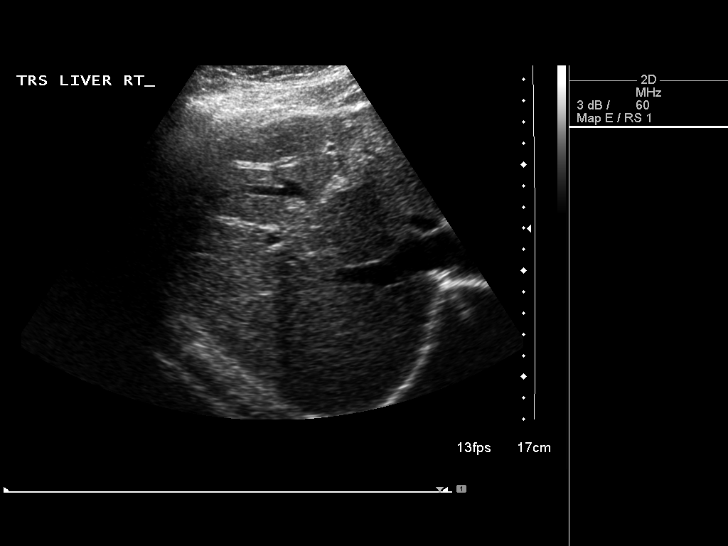
[im 16/34]
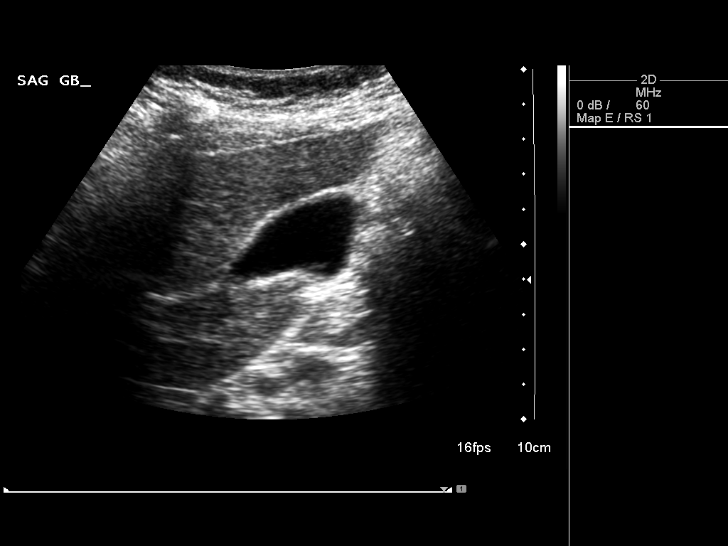
[im 18/34]
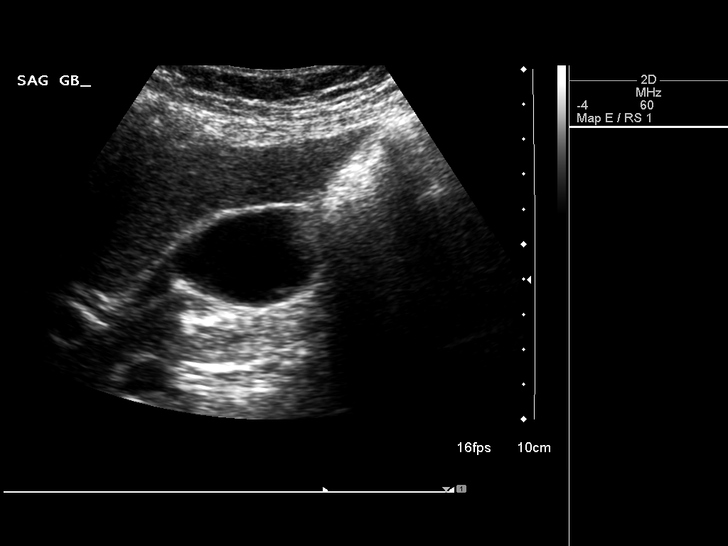
[im 21/34]
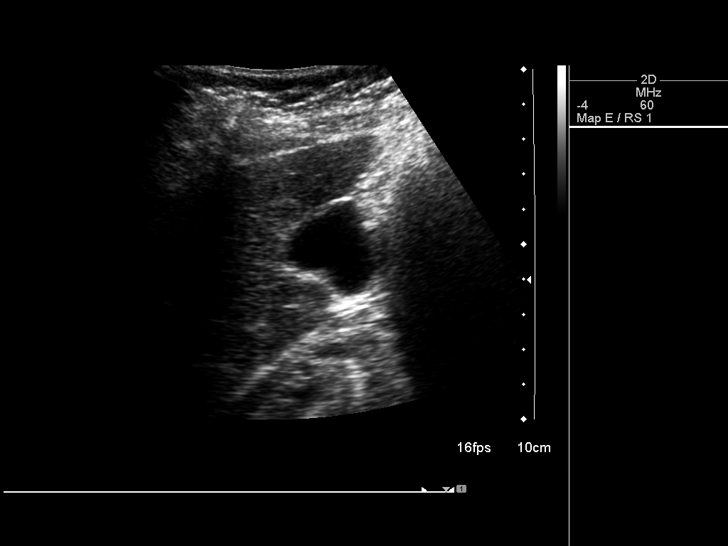
[im 23/34]
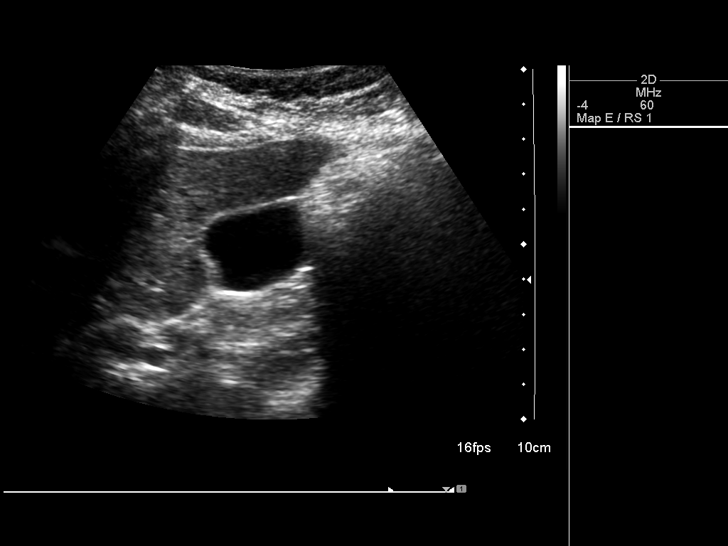
[im 25/34]
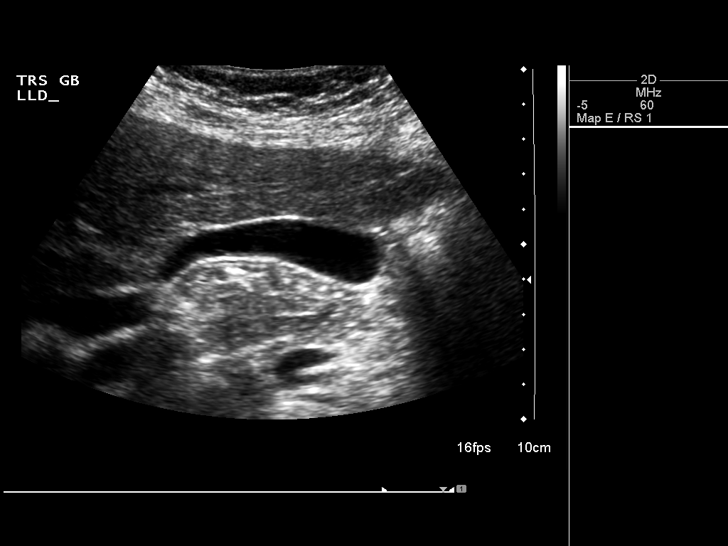
[im 28/34]
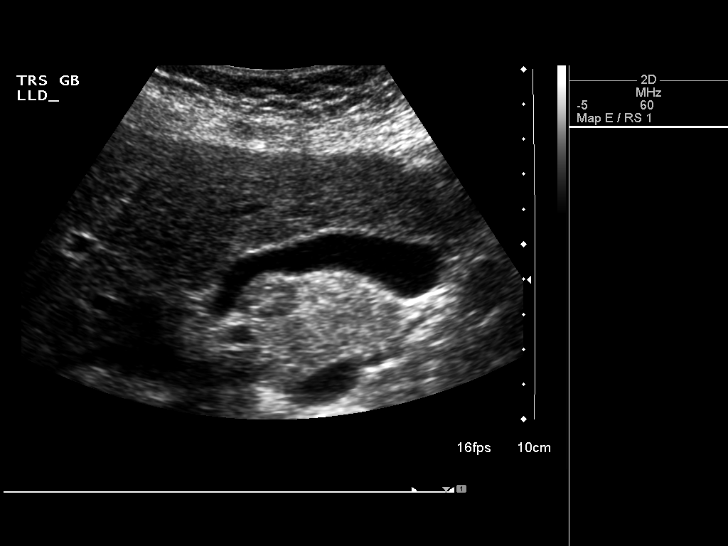
[im 31/34]
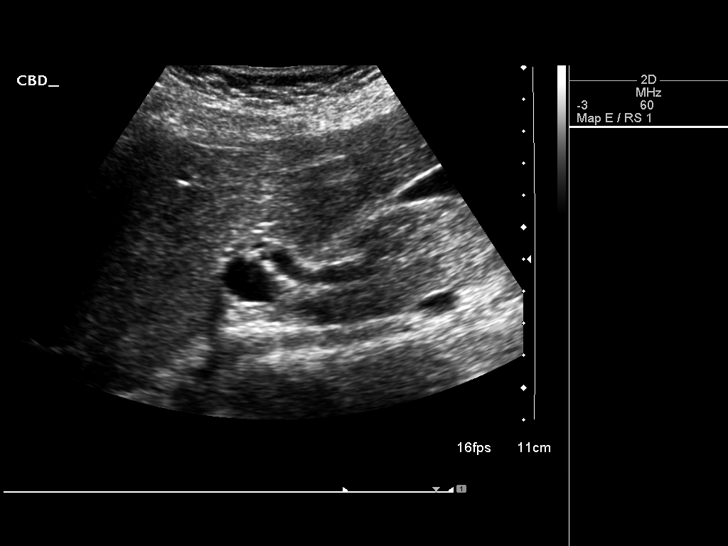
[im 34/34]
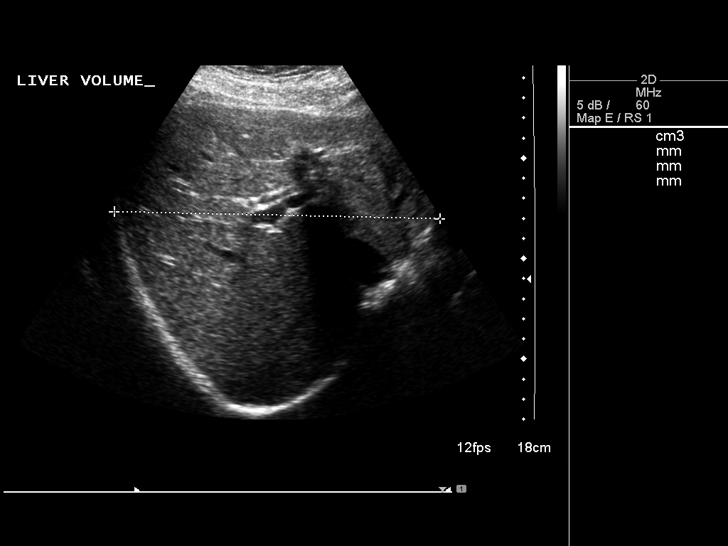

[14 of 25 positions shown; findings below may reference images not displayed]

FINDINGS: Gallbladder:

No gallstones or wall thickening visualized. No sonographic Murphy
sign noted.

Common bile duct:

Diameter: Normal caliber, 5 mm

Liver:

The liver measure slightly enlarged at 18.8 cm in craniocaudal
length. No focal abnormality or biliary ductal dilatation.
IMPRESSION: Borderline hepatomegaly.  No focal abnormality.

## 2014-07-04 ENCOUNTER — Encounter: Payer: Self-pay | Admitting: Cardiology

## 2014-07-04 ENCOUNTER — Ambulatory Visit (INDEPENDENT_AMBULATORY_CARE_PROVIDER_SITE_OTHER): Payer: PRIVATE HEALTH INSURANCE | Admitting: Cardiology

## 2014-07-04 VITALS — BP 94/62 | HR 62 | Ht 67.5 in | Wt 136.0 lb

## 2014-07-04 DIAGNOSIS — I493 Ventricular premature depolarization: Secondary | ICD-10-CM

## 2014-07-04 DIAGNOSIS — M542 Cervicalgia: Secondary | ICD-10-CM

## 2014-07-04 DIAGNOSIS — I4949 Other premature depolarization: Secondary | ICD-10-CM

## 2014-07-04 NOTE — Progress Notes (Signed)
[place]. [address], [place],   [address] Phone: [phone] Fax:  [phone]  Date:  07/04/2014   ID:  Margaret Franco, DOB 08-Dec-1966, MRN [id number]  PCP:  Vidal Schwalbe, MD   History of Present Illness: Margaret Franco is a 48 y.o. female here for followup visit with recent left-sided neck discomfort. In May of 2015 she was noticing a left-sided sharp neck discomfort while walking, which occurred downhill portion of walk. She told her husband about this and there was concern. She was evaluated by Dr. Dema Severin. Musculoskeletal most likely.   She has prior history of PVCs, normal echocardiogram in 2011, anxiety, migraines.  In 2008, PUD, Pepcid and night after noted palpitations. ECHO done. Nothing out of normal range. ETT done twice. [facility].  Event monitor for one month - PVC's. Last ETT- rare PVC noted.  Concerned, Mother with congenital valve abnormality. Prior open heart surgery, valve replacement.   Occasional twinge of chest pain, fleeting. Occasional SOB with activity. Reassuring echocardiogram. Reassuring ETT/stress echo. Event monitor as well.  In May 2015, left neck sharp pain when walking. Stopped exercise at that time. Went away and came back with exercise. Left side of neck is very tight. Burpies no longer.   Not far into walk, downhill feels the left neck. Not uphill.        Wt Readings from Last 3 Encounters:  07/04/14 136 lb (61.689 kg)  02/06/13 144 lb (65.318 kg)     Past Medical History  Diagnosis Date  . Leukocytosis 10/2010    mild  . Adenopathy     submandibular  . Anxiety   . History of colon polyps   . Fibroids     Past Surgical History  Procedure Laterality Date  . Back surgery      No current outpatient prescriptions on file.   No current facility-administered medications for this visit.    Allergies:    Allergies  Allergen Reactions  . Biaxin [Clarithromycin] Hives  . Latex   . Other Other  (See Comments) and Hives    Uncoded Allergy. Allergen: ivp dye, Other Reaction: Other reaction Uncoded Allergy. Allergen: guaffenisin Uncoded Allergy. Allergen: peanuts, Other Reaction: Other reaction KY jelly   . Peanuts [Peanut Oil]   . Wheat Bran     Social History:  The patient  reports that she has never smoked. She has never used smokeless tobacco. She reports that she does not drink alcohol or use illicit drugs.   Family History  Problem Relation Age of Onset  . Stomach cancer Maternal Grandfather   . Migraines Mother   . Heart disease Mother   . Hypertension Mother   . Migraines Brother   . Hypertension Father   . Diabetes Father   . Cancer - Prostate Father   . CVA Father   . Asthma Sister   . Breast cancer Maternal Grandmother   . Cancer - Prostate Paternal Grandfather   . CVA Paternal Grandfather     ROS:  Please see the history of present illness.   Denies any syncope, bleeding, orthopnea, cough, fever. She will occasionally have minor swelling of her feet when waking up in the morning with some soreness of her feet.   All other systems reviewed and negative.   PHYSICAL EXAM: VS:  BP 94/62  Pulse 62  Ht 5' 7.5" (1.715 m)  Wt 136 lb (61.689 kg)  BMI 20.97 kg/m2 Well nourished, well developed, in  no acute distressThin HEENT: normal, [place]/AT, EOMI Neck: no JVD, normal carotid upstroke, no bruit Cardiac:  normal S1, S2; RRR; no murmur Lungs:  clear to auscultation bilaterally, no wheezing, rhonchi or rales Abd: soft, nontender, no hepatomegaly, no bruits Ext: no edema, 2+ distal pulses Skin: warm and dry GU: deferred Neuro: no focal abnormalities noted, AAO x 3  EKG:  05/17/14-sinus rhythm, no irregular mouth his, normal intervals  ASSESSMENT AND PLAN:  1. Left-sided neck pain-no bruits, agree that this was likely musculoskeletal. She is not experiencing this on her most exertional portions of her walk. Stretching. Previous stress tests have been normal.  Cardiogram previously was normal. If symptoms become more worrisome, she will contact me. Continue with exercise. 2. PVCs-overall doing well. She has not felt any in quite some time. She is concerned that some of her feet swelling maybe menopausal symptoms. She is watching her salt and liquid. 3. I will see back on as-needed basis.  Signed, Candee Furbish, MD [facility]  07/04/2014 11:25 AM

## 2014-07-04 NOTE — Patient Instructions (Signed)
Your physician recommends that you schedule a follow-up appointment as needed  

## 2015-01-14 ENCOUNTER — Other Ambulatory Visit: Payer: Self-pay

## 2015-01-14 DIAGNOSIS — Z1231 Encounter for screening mammogram for malignant neoplasm of breast: Secondary | ICD-10-CM

## 2015-04-15 ENCOUNTER — Ambulatory Visit: Payer: PRIVATE HEALTH INSURANCE

## 2015-04-18 ENCOUNTER — Ambulatory Visit: Payer: PRIVATE HEALTH INSURANCE

## 2015-04-19 ENCOUNTER — Ambulatory Visit
Admission: RE | Admit: 2015-04-19 | Discharge: 2015-04-19 | Disposition: A | Discharge status: Home / Self Care | Payer: PRIVATE HEALTH INSURANCE | Source: Ambulatory Visit

## 2015-04-19 DIAGNOSIS — Z1231 Encounter for screening mammogram for malignant neoplasm of breast: Secondary | ICD-10-CM

## 2018-10-17 ENCOUNTER — Other Ambulatory Visit: Payer: Self-pay | Admitting: Family Medicine

## 2018-10-17 ENCOUNTER — Ambulatory Visit
Admission: RE | Admit: 2018-10-17 | Discharge: 2018-10-17 | Disposition: A | Discharge status: Home / Self Care | Payer: PRIVATE HEALTH INSURANCE | Source: Ambulatory Visit | Attending: Family Medicine | Admitting: Family Medicine

## 2018-10-17 DIAGNOSIS — J40 Bronchitis, not specified as acute or chronic: Secondary | ICD-10-CM

## 2018-10-17 IMAGING — CR DG CHEST 2V
2 series · 2 of 2 positions shown · non-contrast
Comparison: [DATE]

CLINICAL DATA: Fever.  Upper respiratory infection.

EXAM:
CHEST - 2 VIEW

[w chest pa]
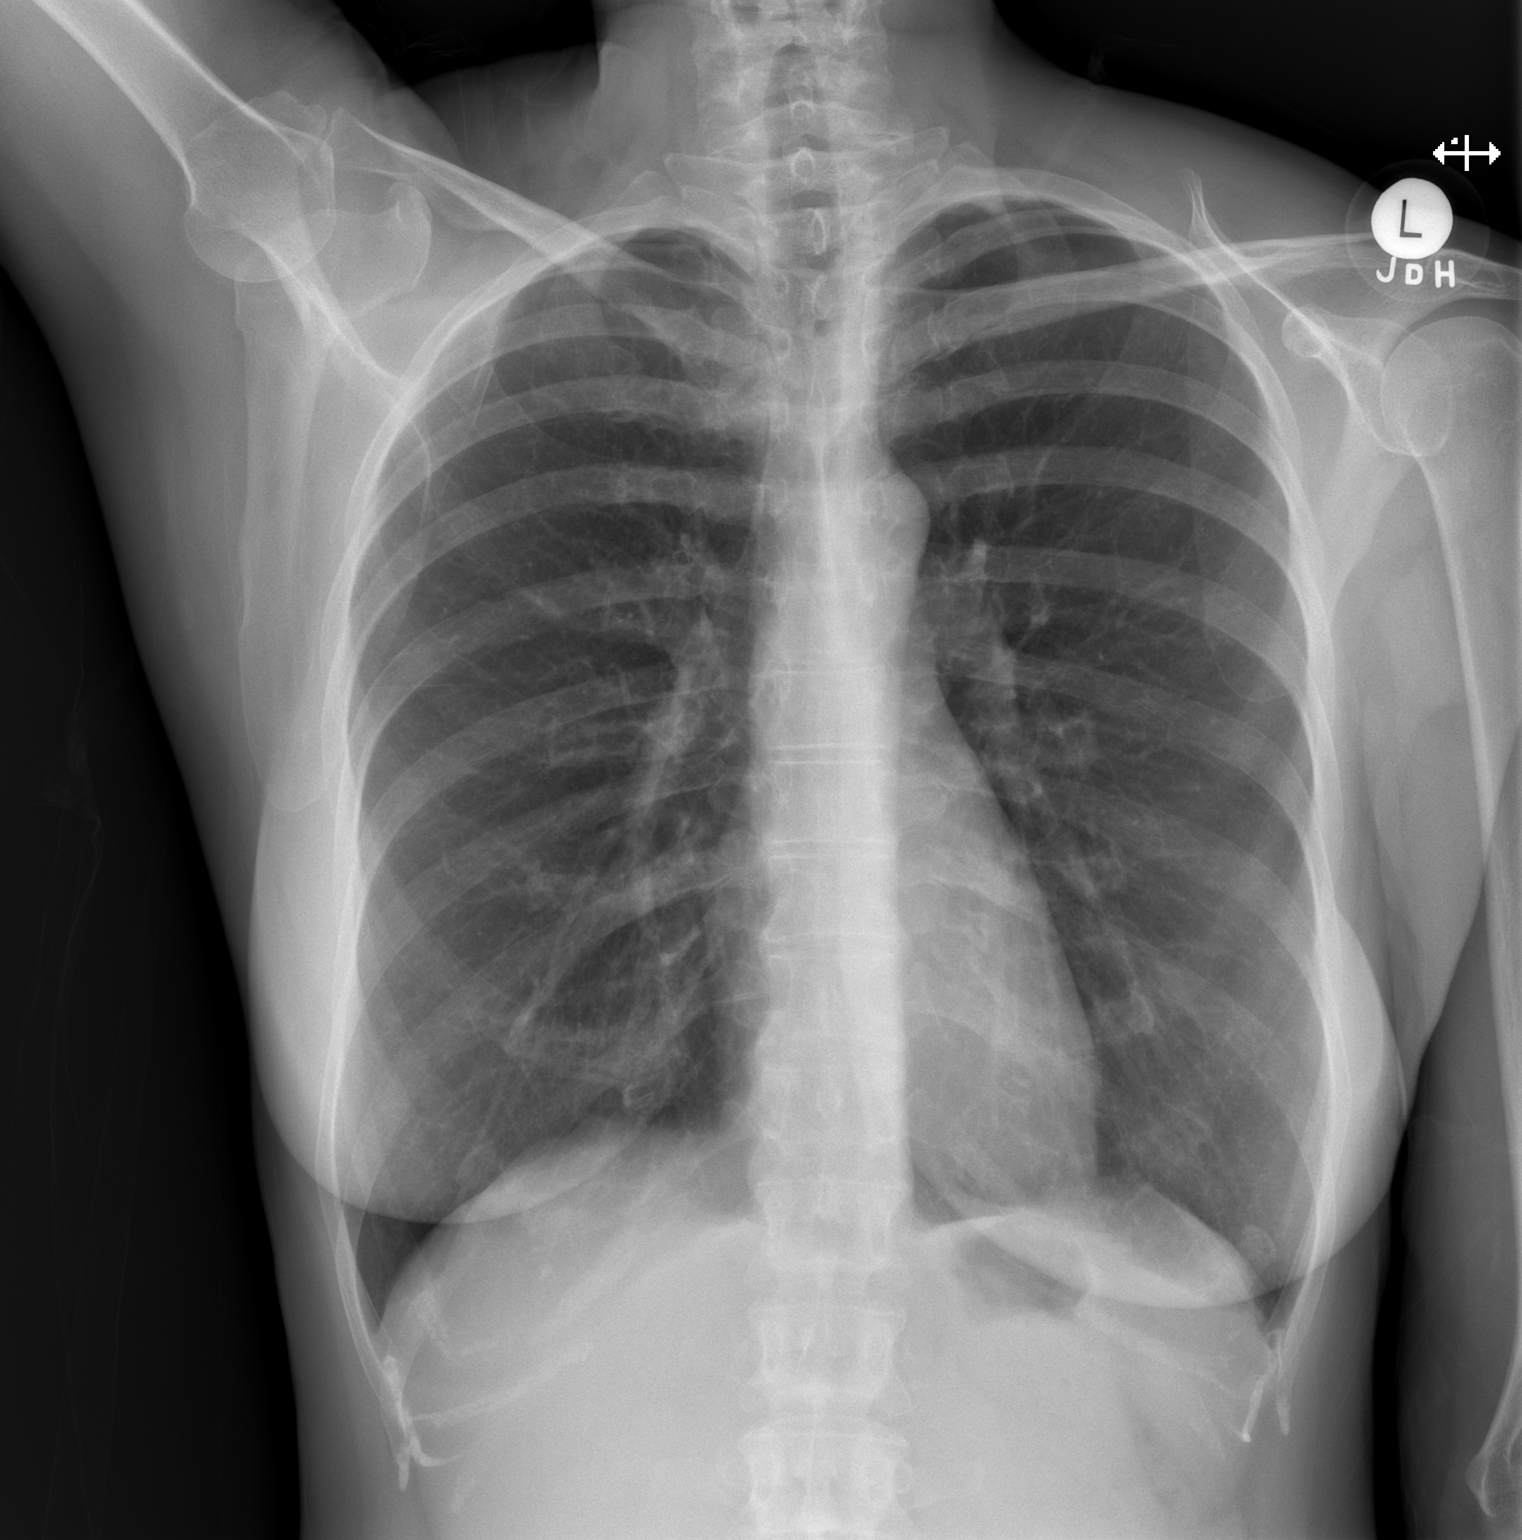

[w chest lat]
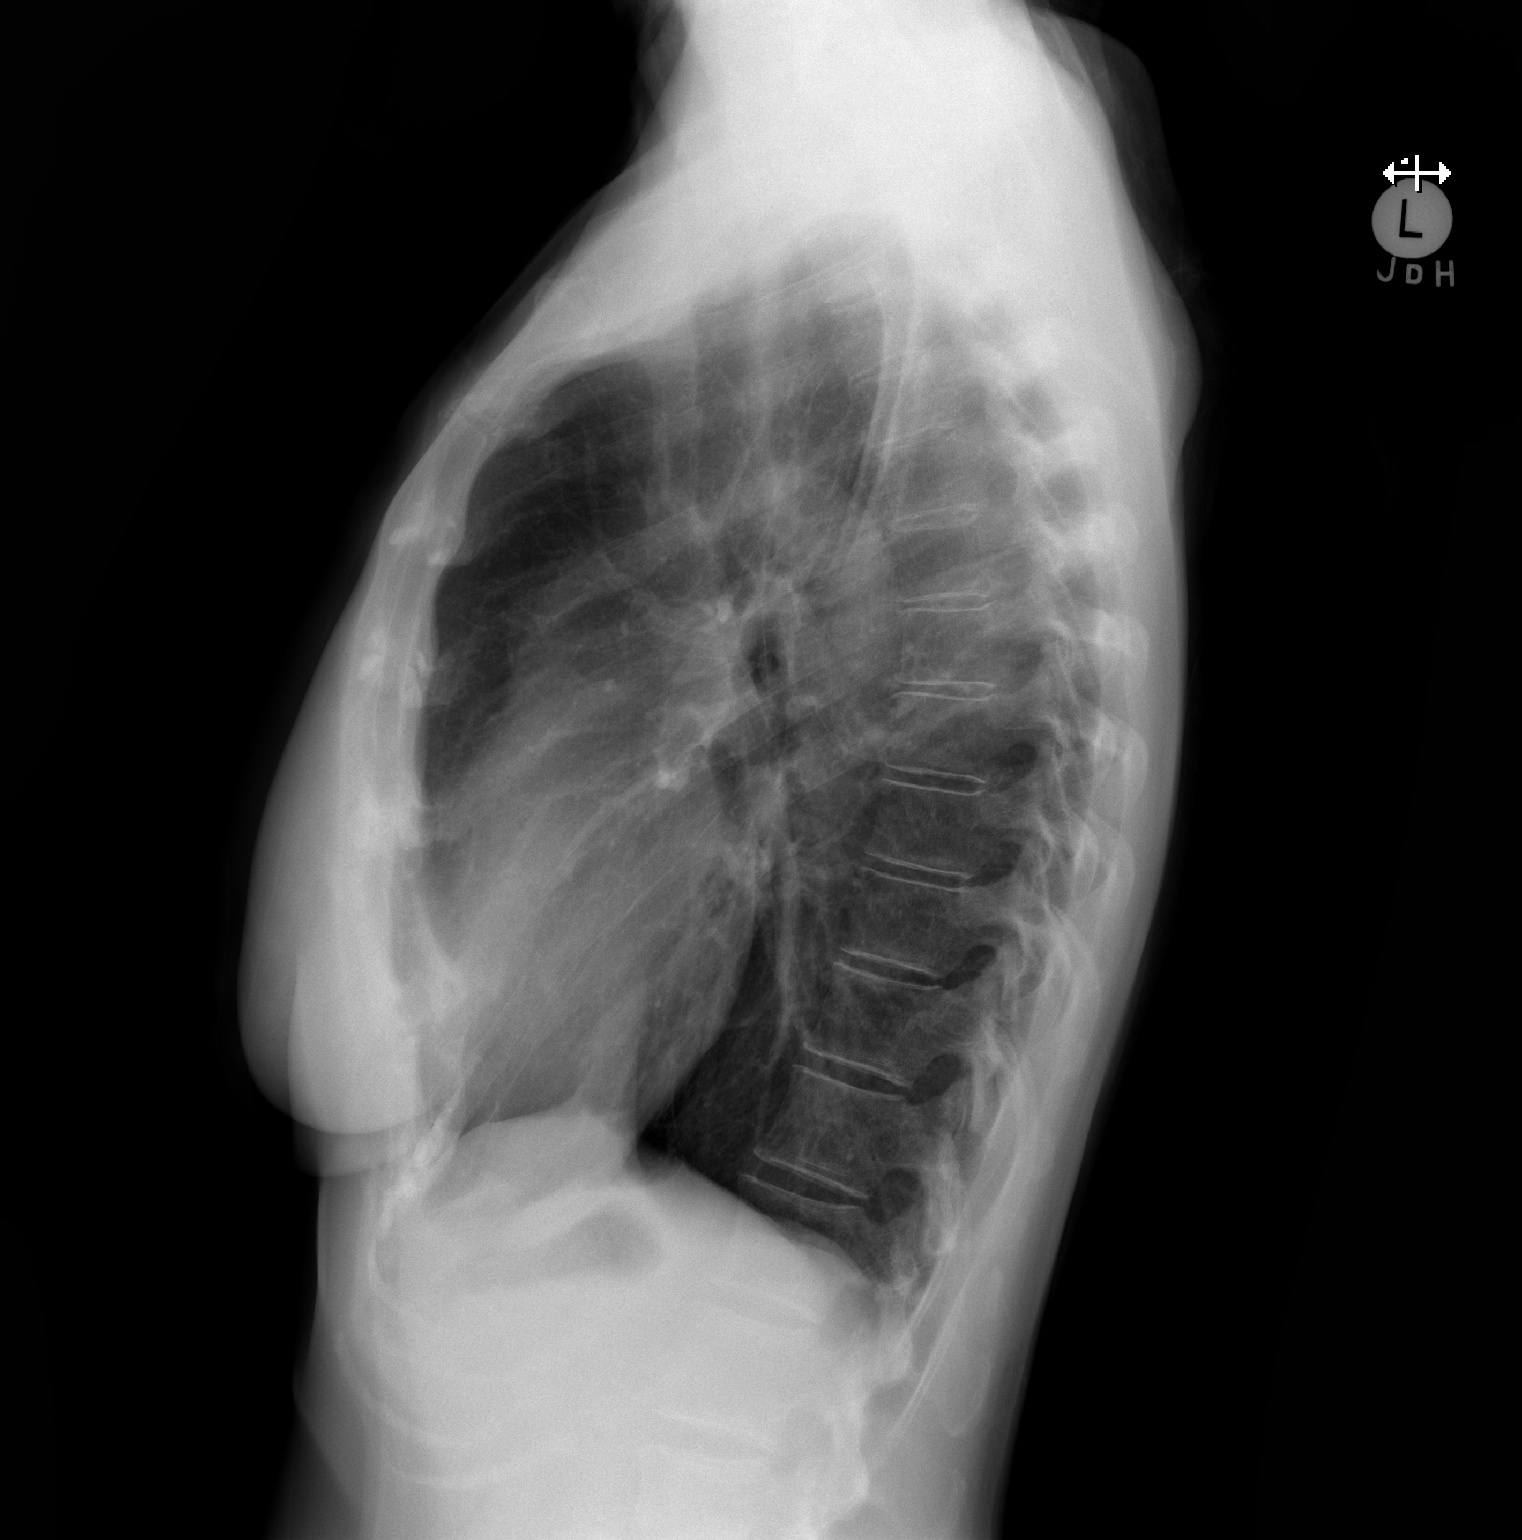

[2 of 2 positions shown; findings below may reference images not displayed]

FINDINGS: The heart size and mediastinal contours are within normal limits.
Both lungs are clear. The visualized skeletal structures are
unremarkable.
IMPRESSION: No active cardiopulmonary disease.

## 2018-12-06 ENCOUNTER — Ambulatory Visit: Payer: Self-pay | Admitting: Allergy

## 2019-03-13 ENCOUNTER — Ambulatory Visit: Payer: Self-pay | Admitting: Allergy and Immunology

## 2019-08-31 ENCOUNTER — Other Ambulatory Visit: Payer: Self-pay

## 2019-08-31 ENCOUNTER — Ambulatory Visit (INDEPENDENT_AMBULATORY_CARE_PROVIDER_SITE_OTHER): Payer: No Typology Code available for payment source | Admitting: Allergy

## 2019-08-31 ENCOUNTER — Encounter: Payer: Self-pay | Admitting: Allergy

## 2019-08-31 VITALS — BP 108/60 | HR 72 | Temp 97.1°F | Resp 18 | Ht 68.0 in | Wt 125.5 lb

## 2019-08-31 DIAGNOSIS — Z9109 Other allergy status, other than to drugs and biological substances: Secondary | ICD-10-CM | POA: Insufficient documentation

## 2019-08-31 DIAGNOSIS — T781XXD Other adverse food reactions, not elsewhere classified, subsequent encounter: Secondary | ICD-10-CM

## 2019-08-31 DIAGNOSIS — R21 Rash and other nonspecific skin eruption: Secondary | ICD-10-CM

## 2019-08-31 DIAGNOSIS — T781XXA Other adverse food reactions, not elsewhere classified, initial encounter: Secondary | ICD-10-CM | POA: Insufficient documentation

## 2019-08-31 NOTE — Assessment & Plan Note (Addendum)
History of reactions to peanuts (facial swelling), apples (nausea and reflux) and barley (? Hives). Patient does not want to get repeat testing for these. However she is concerned for additional food allergies causing her rash/hives.   Today's skin testing showed: Borderline to cod and crab.  Avoid any seafood and shellfish for 1 month and then put back into diet one by one and monitor symptoms.   Continue to avoid peanuts, barley and apples.  For mild symptoms you can take over the counter antihistamines such as Benadryl and monitor symptoms closely. If symptoms worsen or if you have severe symptoms including breathing issues, throat closure, significant swelling, whole body hives, severe diarrhea and vomiting, lightheadedness then inject epinephrine and seek immediate medical care afterwards.

## 2019-08-31 NOTE — Patient Instructions (Addendum)
Today's skin testing showed: Positive to dust mites. Borderline to cod and crab.  Avoid any seafood and shellfish for 1 month and if not better then put back into diet one by one and monitor symptoms.   Start zyrtec 10mg  at night to help with itching. Monitor rash. Take pictures if it flares and let us know.  Avoid vitamin C for now.  Get bloodwork.  Continue to avoid peanuts, barley and apples. For mild symptoms you can take over the counter antihistamines such as Benadryl and monitor symptoms closely. If symptoms worsen or if you have severe symptoms including breathing issues, throat closure, significant swelling, whole body hives, severe diarrhea and vomiting, lightheadedness then inject epinephrine and seek immediate medical care afterwards.  Follow up in 2 months or sooner if needed.   Control of House Dust Mite Allergen . Dust mite allergens are a common trigger of allergy and asthma symptoms. While they can be found throughout the house, these microscopic creatures thrive in warm, humid environments such as bedding, upholstered furniture and carpeting. . Because so much time is spent in the bedroom, it is essential to reduce mite levels there.  . Encase pillows, mattresses, and box springs in special allergen-proof fabric covers or airtight, zippered plastic covers.  . Bedding should be washed weekly in hot water (130 F) and dried in a hot dryer. Allergen-proof covers are available for comforters and pillows that can't be regularly washed.  Wendee Copp the allergy-proof covers every few months. Minimize clutter in the bedroom. Keep pets out of the bedroom.  Marland Kitchen Keep humidity less than 50% by using a dehumidifier or air conditioning. You can buy a humidity measuring device called a hygrometer to monitor this.  . If possible, replace carpets with hardwood, linoleum, or washable area rugs. If that's not possible, vacuum frequently with a vacuum that has a HEPA filter. . Remove all upholstered  furniture and non-washable window drapes from the bedroom. . Remove all non-washable stuffed toys from the bedroom.  Wash stuffed toys weekly.  Skin care recommendations  Bath time: . Always use lukewarm water. AVOID very hot or cold water. Marland Kitchen Keep bathing time to 5-10 minutes. . Do NOT use bubble bath. . Use a mild soap and use just enough to wash the dirty areas. . Do NOT scrub skin vigorously.  . After bathing, pat dry your skin with a towel. Do NOT rub or scrub the skin.  Moisturizers and prescriptions:  . ALWAYS apply moisturizers immediately after bathing (within 3 minutes). This helps to lock-in moisture. . Use the moisturizer several times a day over the whole body. Kermit Balo summer moisturizers include: Aveeno, CeraVe, Cetaphil. Kermit Balo winter moisturizers include: Aquaphor, Vaseline, Cerave, Cetaphil, Eucerin, Vanicream. . When using moisturizers along with medications, the moisturizer should be applied about one hour after applying the medication to prevent diluting effect of the medication or moisturize around where you applied the medications. When not using medications, the moisturizer can be continued twice daily as maintenance.  Laundry and clothing: . Avoid laundry products with added color or perfumes. . Use unscented hypo-allergenic laundry products such as Tide free, Cheer free & gentle, and All free and clear.  . If the skin still seems dry or sensitive, you can try double-rinsing the clothes. . Avoid tight or scratchy clothing such as wool. . Do not use fabric softeners or dyer sheets.

## 2019-08-31 NOTE — Progress Notes (Signed)
New Patient Note  RE: MILAAN Franco MRN: [id number] DOB: July 07, 1966 Date of Office Visit: 08/31/2019  Referring provider: Harlan Stains, MD Primary care provider: Harlan Stains, MD  Chief Complaint: Urticaria  History of Present Illness: I had the pleasure of seeing Margaret Franco for initial evaluation at the [facility] on 08/31/2019. She is a 53 y.o. female, who is self-referred here for the evaluation of hives.  Patient was seen in our office by Dr. Ishmael Holter in 2008.  Rash started about 3 weeks ago. She initially noticed a small rash on her legs. She used some topical benadryl cream which did not help.  She also had some mosquito bites on her wrist which started to spread up her arms b/l into hives. The rash spread to her upper thigh and lower back area as well. It is much better now.   Describes them as red, pruritic, raised. Individual rashes lasts about few weeks. Associated symptoms include: none. Suspected triggers are unknown but worse with heat. Denies any fevers, chills, changes in medications, foods, personal care products or recent infections. She had a headache about 2 weeks before onset. She had negative COVID testing.  She has tried the following therapies: Claritin made things worse. She stopped the Claritin and the rash seemed to improve but not completely gone. She also used topical benadryl and steroid cream. Systemic steroids no. Currently on no medications.  Previous work up includes: none.  Previous history of rash/hives: hives from food allergy and drug allergy in the past.   Vitamin C only taking as needed but was using more of it lately.  No recent tick bites that she is aware of. Patient does not consume red meat.  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  Peanuts cause facial swelling. Apples cause nausea and reflux. Barley may have caused hives in the past.  She has a list of foods that she has been eating and wants to  get tested for.   Patient has been trying to eliminate foods and has not been able to figure out a trigger.  Main diet includes rice and vegetables.   Assessment and Plan: [name] is a 53 y.o. female with: Rash and other nonspecific skin eruption New onset rash for 3 weeks which is improving. No triggers noted but heat makes it worse. Denies changes in diet, medications, personal care products or recent infections. She tried food eliminations with no changes. She also has been taking increasing amounts of vitamin C.   Today's skin testing showed: Positive to dust mites. Borderline to cod and crab.  Discussed with patient based on her clinical history and test results it's unlikely that the above triggered this event.   Avoid any seafood and shellfish for 1 month and if not better then put back into diet one by one and monitor symptoms.   Start zyrtec 10mg  at night to help with itching.  Monitor rash. Take pictures if it flares and let us know.   Avoid vitamin C for now.  Get bloodwork.   Discussed dust mite environmental control measures.   Gave handout on proper skin care.   House dust mite allergy  See assessment and plan as above.   Adverse food reaction History of reactions to peanuts (facial swelling), apples (nausea and reflux) and barley (? Hives). Patient does not want to get repeat testing for these. However she is concerned for additional food allergies causing her rash/hives.   Today's skin  testing showed: Borderline to cod and crab.  Avoid any seafood and shellfish for 1 month and then put back into diet one by one and monitor symptoms.   Continue to avoid peanuts, barley and apples.  For mild symptoms you can take over the counter antihistamines such as Benadryl and monitor symptoms closely. If symptoms worsen or if you have severe symptoms including breathing issues, throat closure, significant swelling, whole body hives, severe diarrhea and vomiting,  lightheadedness then inject epinephrine and seek immediate medical care afterwards.  Return in about 2 months (around 10/31/2019).  Lab Orders     ANA w/Reflex     CBC with Differential/Platelet     Comprehensive metabolic panel     C3 and C4     Thyroid Cascade Profile     Tryptase  Other allergy screening: Asthma: no Rhino conjunctivitis: yes  Some rhinitis symptoms with grass and pine pollen exposure.  Medication allergy: yes  See allergy list on the last.  Hymenoptera allergy: no Urticaria: yes Eczema: yes History of recurrent infections suggestive of immunodeficency: no  Diagnostics: Skin Testing: Select foods and indoor allergens Positive test to: dust mites and borderline to codfish and crab.  Results discussed with patient/family. Airborne Adult Perc - 08/31/19 0920    Time Antigen Placed  0920    Allergen Manufacturer  Lavella Hammock    Location  Back    Number of Test  3    Panel 1  Select    2. Control-Histamine 1 mg/ml  2+    51. Mite, D Farinae  5,000 AU/ml  4+    52. Mite, D Pteronyssinus  5,000 AU/ml  Negative    53. Cat Hair 10,000 BAU/ml  Negative     Food Adult Perc - 08/31/19 0900    Time Antigen Placed  [id number]    Allergen Manufacturer  Lavella Hammock    Location  Back    Number of allergen test  71     Control-buffer 50% Glycerol  Negative    Control-Histamine 1 mg/ml  2+    2. Soybean  Negative    3. Wheat  Negative    4. Sesame  Negative    5. Milk, cow  Negative    6. Egg White, Chicken  Negative    7. Casein  Negative    8. Shellfish Mix  Negative    9. Fish Mix  Negative    10. Cashew  Negative    11. Pecan Food  Negative    12. [place]  Negative    13. Almond  Negative    14. Hazelnut  Negative    15. [place] nut  Negative    16. Coconut  Negative    17. Pistachio  Negative    18. Catfish  Negative    19. Bass  Negative    20. Trout  Negative    21. Tuna  Negative    22. Salmon  Negative    23. Flounder  Negative    24. Codfish  --   +/-    25. Shrimp  Negative    26. Crab  --   +/-   27. Lobster  Negative    28. Oyster  Negative    29. Scallops  Negative    31. Oat   Negative    32. Rye   Negative    33. Hops  Negative    34. Rice  Negative    35. Cottonseed  Negative  36. Saccharomyces Cerevisiae   Negative    37. Pork  Negative    38. [place] Meat  Negative    39. Chicken Meat  Negative    40. Beef  Negative    41. Lamb  Negative    42. Tomato  Negative    43. White Potato  Negative    44. Sweet Potato  Negative    45. Pea, Green/English  Negative    46. Navy Bean  Negative    47. Mushrooms  Negative    48. Avocado  Negative    49. Onion  Negative    50. Cabbage  Negative    51. Carrots  Negative    52. Celery  Negative    53. Corn  Negative    54. Cucumber  Negative    55. Grape (White seedless)  Negative    56. Orange   Negative    57. Banana  Negative    59. Peach  Negative    60. Strawberry  Negative    61. Cantaloupe  Negative    62. Watermelon  Negative    63. Pineapple  Negative    64. Chocolate/Cacao bean  Negative    65. Karaya Gum  Negative    66. Acacia (Arabic Gum)  Negative    67. Cinnamon  Negative    68. Nutmeg  Negative    69. Ginger  Negative    70. Garlic  Negative    71. Pepper, black  Negative    72. Mustard  Negative       Past Medical History: Patient Active Problem List   Diagnosis Date Noted  . Adverse food reaction 08/31/2019  . Rash and other nonspecific skin eruption 08/31/2019  . House dust mite allergy 08/31/2019  . Dense breasts 02/26/2013  . Irregular menses 02/06/2013  . Anxiety   . History of colon polyps   . Fibroids   . Leukocytosis 10/21/2010   Past Medical History:  Diagnosis Date  . Adenopathy    submandibular  . Anxiety   . Fibroids   . History of colon polyps   . Leukocytosis 10/2010   mild   Past Surgical History: Past Surgical History:  Procedure Laterality Date  . BACK SURGERY     Medication List:  Current Outpatient Medications   Medication Sig Dispense Refill  . Ascorbic Acid (VITAMIN C) 500 MG CAPS Take 500 mg by mouth daily as needed.    . Cholecalciferol (VITAMIN D3) 1.25 MG (50000 UT) CAPS Vitamin D3  500 IU daily    . EPINEPHrine 0.3 mg/0.3 mL IJ SOAJ injection AS DIRECTED IF LIFE THREATENING REACTION OCCURS INJECTION    . triamcinolone cream (KENALOG) 0.1 % APPLY TO AFFECTED AREA TWICE A DAY AS NEEDED    . vitamin B-12 (CYANOCOBALAMIN) 100 MCG tablet Vitamin B-12  daily    . Zinc Acetate-Sweetleaf (FP ZINC LOZENGES PO) Take by mouth.     No current facility-administered medications for this visit.    Allergies: Allergies  Allergen Reactions  . Clarithromycin Hives  . Guaifenesin   . Iodinated Diagnostic Agents   . Iodine Hives  . Latex   . Other Other (See Comments) and Hives    Uncoded Allergy. Allergen: ivp dye, Other Reaction: Other reaction Uncoded Allergy. Allergen: guaffenisin Uncoded Allergy. Allergen: peanuts, Other Reaction: Other reaction KY jelly   . Peanut Oil   . Wheat Bran    Social History: Social History   Socioeconomic History  .  Marital status: Married    Spouse name: Not on file  . Number of children: Not on file  . Years of education: Not on file  . Highest education level: Not on file  Occupational History  . Not on file  Social Needs  . Financial resource strain: Not on file  . Food insecurity    Worry: Not on file    Inability: Not on file  . Transportation needs    Medical: Not on file    Non-medical: Not on file  Tobacco Use  . Smoking status: Never Smoker  . Smokeless tobacco: Never Used  Substance and Sexual Activity  . Alcohol use: No  . Drug use: No  . Sexual activity: Yes    Birth control/protection: Condom  Lifestyle  . Physical activity    Days per week: Not on file    Minutes per session: Not on file  . Stress: Not on file  Relationships  . Social Herbalist on phone: Not on file    Gets together: Not on file    Attends  religious service: Not on file    Active member of club or organization: Not on file    Attends meetings of clubs or organizations: Not on file    Relationship status: Not on file  Other Topics Concern  . Not on file  Social History Narrative  . Not on file   Lives in a house built in [place]. Smoking: denies Occupation: Producer, television/film/video HistoryFreight forwarder in the house: no Carpet in the family room: no Carpet in the bedroom: no Heating: gas Cooling: central Pet: yes 1 cat x 8-9 years.   Family History: Family History  Problem Relation Age of Onset  . Migraines Mother   . Heart disease Mother   . Hypertension Mother   . Hypertension Father   . Diabetes Father   . Cancer - Prostate Father   . CVA Father   . Cancer - Prostate Paternal Grandfather   . CVA Paternal Grandfather   . Stomach cancer Maternal Grandfather   . Migraines Brother   . Asthma Sister   . Breast cancer Maternal Grandmother    Review of Systems  Constitutional: Negative for appetite change, chills, fever and unexpected weight change.  HENT: Negative for congestion and rhinorrhea.   Eyes: Negative for itching.  Respiratory: Negative for cough, chest tightness, shortness of breath and wheezing.   Cardiovascular: Negative for chest pain.  Gastrointestinal: Negative for abdominal pain.  Genitourinary: Negative for difficulty urinating.  Skin: Positive for rash.  Allergic/Immunologic: Positive for environmental allergies and food allergies.  Neurological: Negative for headaches.   Objective: BP 108/60 (BP Location: Right Arm, Patient Position: Sitting, Cuff Size: Normal)   Pulse 72   Temp (!) 97.1 F (36.2 C) (Temporal)   Resp 18   Ht 5\' 8"  (1.727 m)   Wt 125 lb 8 oz (56.9 kg)   SpO2 100%   BMI 19.08 kg/m  Body mass index is 19.08 kg/m. Physical Exam  Constitutional: She is oriented to person, place, and time. She appears well-developed and well-nourished.  HENT:   Head: Normocephalic and atraumatic.  Patient declines ENT exam  Eyes: Conjunctivae and EOM are normal.  Neck: Neck supple.  Cardiovascular: Normal rate, regular rhythm and normal heart sounds. Exam reveals no gallop and no friction rub.  No murmur heard. Pulmonary/Chest: Effort normal and breath sounds normal. She has no wheezes. She has no rales.  Abdominal: Soft.  Neurological: She is alert and oriented to person, place, and time.  Skin: Skin is warm. Rash noted.  Erythematous hue on upper extremities b/l.  Psychiatric: She has a normal mood and affect. Her behavior is normal.  Nursing note and vitals reviewed.  The plan was reviewed with the patient/family, and all questions/concerned were addressed.  It was my pleasure to see Anderson today and participate in her care. Please feel free to contact me with any questions or concerns.  Sincerely,  Rexene Alberts, DO Allergy & Immunology  [facility] office: [phone] [facility] office: [place] office: [phone]

## 2019-08-31 NOTE — Assessment & Plan Note (Signed)
.   See assessment and plan as above. 

## 2019-08-31 NOTE — Assessment & Plan Note (Addendum)
New onset rash for 3 weeks which is improving. No triggers noted but heat makes it worse. Denies changes in diet, medications, personal care products or recent infections. She tried food eliminations with no changes. She also has been taking increasing amounts of vitamin C.   Today's skin testing showed: Positive to dust mites. Borderline to cod and crab.  Discussed with patient based on her clinical history and test results it's unlikely that the above triggered this event.   Avoid any seafood and shellfish for 1 month and if not better then put back into diet one by one and monitor symptoms.   Start zyrtec 10mg  at night to help with itching.  Monitor rash. Take pictures if it flares and let us know.   Avoid vitamin C for now.  Get bloodwork.   Discussed dust mite environmental control measures.   Gave handout on proper skin care.

## 2019-09-04 ENCOUNTER — Telehealth: Payer: Self-pay | Admitting: Allergy

## 2019-09-04 ENCOUNTER — Telehealth: Payer: Self-pay

## 2019-09-04 DIAGNOSIS — T781XXD Other adverse food reactions, not elsewhere classified, subsequent encounter: Secondary | ICD-10-CM

## 2019-09-04 NOTE — Telephone Encounter (Signed)
Please send Allergy test results to patient home. She requested this to happen last appt but nothing was mentioned to be sent out.

## 2019-09-04 NOTE — Telephone Encounter (Signed)
Labs for Tapioca, Squash Summer, Tea IgE and Millet IgE order as Misc. Labs due to it being [facility].

## 2019-09-04 NOTE — Telephone Encounter (Signed)
Thank you :)

## 2019-09-04 NOTE — Telephone Encounter (Signed)
Ordered bloodwork for blueberry, quinoa, wheat, gluten, tapioca, sunflower, plum, fig, pear, lemon, lime, kiwi, peppers, broccoli, cauliflower, squash  No testing available locally for sorghum, spelt - this is a derivative of wheat, will order wheat/gluten instead; canola oils, safflower, date.    Can you check how to order for the following? I found it on [facility] website but can't order in epic:  Millet IgE: TEST: [id number] Test number copied CPT: [id number]  Tea IgE: TEST: [id number] Test number copied CPT: [phone]

## 2019-09-04 NOTE — Telephone Encounter (Signed)
Patient requested a copy of her Allergy Testing results.  [facility] sent me a staff message asking that I mail them to the patient.  I printed and mailed the test results to her home address on file on Friday September 01, 2019.  Mailman picked up the envelope today 09/04/2019.

## 2019-09-04 NOTE — Telephone Encounter (Signed)
Patient is calling to see about doing lab work for the items she didn't get to test for at her allergy testing appointment  On 08-31-2019.

## 2019-09-04 NOTE — Telephone Encounter (Signed)
Patient is wanting labs to be completed for the following: blueberry, Quinoa, Sorghum, Spelt, Millet, Tapioca, Black Tea, Canola oils, sunflower, sasslower, plums, dates, figs, pear, lime/lemon, kiwi, peppers, broccoli, cauliflower, squash,

## 2019-09-06 ENCOUNTER — Ambulatory Visit: Payer: Self-pay | Admitting: Allergy

## 2020-06-06 ENCOUNTER — Other Ambulatory Visit: Payer: Self-pay | Admitting: Physician Assistant

## 2020-06-06 DIAGNOSIS — R42 Dizziness and giddiness: Secondary | ICD-10-CM

## 2020-06-11 ENCOUNTER — Encounter (INDEPENDENT_AMBULATORY_CARE_PROVIDER_SITE_OTHER): Payer: Self-pay | Admitting: Otolaryngology

## 2020-06-11 ENCOUNTER — Ambulatory Visit (INDEPENDENT_AMBULATORY_CARE_PROVIDER_SITE_OTHER): Payer: Commercial Managed Care - PPO | Admitting: Otolaryngology

## 2020-06-11 ENCOUNTER — Other Ambulatory Visit: Payer: Self-pay

## 2020-06-11 VITALS — Temp 97.7°F

## 2020-06-11 DIAGNOSIS — H6123 Impacted cerumen, bilateral: Secondary | ICD-10-CM

## 2020-06-11 NOTE — Progress Notes (Signed)
HPI: Margaret Franco is a 54 y.o. female who presents is referred by Sadie Haber at [facility] for evaluation of cerumen buildup bilaterally as well as recent history of dizziness.  Patient apparently developed acute onset of dizziness when getting out of bed early 1 morning a week and a half ago.  She did not notice any change in her hearing.  She felt off balance with a sensation of dizziness.  She was seen by her PCP who tried to clean wax from the ears but was unsuccessful and referred here. She has had no significant episodes of vertigo or dizziness over the past week. She has been using Debrox to try to help clean the ears.  Past Medical History:  Diagnosis Date  . Adenopathy    submandibular  . Anxiety   . Fibroids   . History of colon polyps   . Leukocytosis 10/2010   mild   Past Surgical History:  Procedure Laterality Date  . BACK SURGERY     Social History   Socioeconomic History  . Marital status: Married    Spouse name: Not on file  . Number of children: Not on file  . Years of education: Not on file  . Highest education level: Not on file  Occupational History  . Not on file  Tobacco Use  . Smoking status: Never Smoker  . Smokeless tobacco: Never Used  Vaping Use  . Vaping Use: Never used  Substance and Sexual Activity  . Alcohol use: No  . Drug use: No  . Sexual activity: Yes    Birth control/protection: Condom  Other Topics Concern  . Not on file  Social History Narrative  . Not on file   Social Determinants of Health   Financial Resource Strain:   . Difficulty of Paying Living Expenses:   Food Insecurity:   . Worried About Charity fundraiser in the Last Year:   . Arboriculturist in the Last Year:   Transportation Needs:   . Film/video editor (Medical):   Marland Kitchen Lack of Transportation (Non-Medical):   Physical Activity:   . Days of Exercise per Week:   . Minutes of Exercise per Session:   Stress:   . Feeling of Stress :   Social Connections:   .  Frequency of Communication with Friends and Family:   . Frequency of Social Gatherings with Friends and Family:   . Attends Religious Services:   . Active Member of Clubs or Organizations:   . Attends Archivist Meetings:   Marland Kitchen Marital Status:    Family History  Problem Relation Age of Onset  . Migraines Mother   . Heart disease Mother   . Hypertension Mother   . Hypertension Father   . Diabetes Father   . Cancer - Prostate Father   . CVA Father   . Cancer - Prostate Paternal Grandfather   . CVA Paternal Grandfather   . Stomach cancer Maternal Grandfather   . Migraines Brother   . Asthma Sister   . Breast cancer Maternal Grandmother    Allergies  Allergen Reactions  . Clarithromycin Hives  . Guaifenesin   . Iodinated Diagnostic Agents   . Iodine Hives  . Latex   . Other Other (See Comments) and Hives    Uncoded Allergy. Allergen: ivp dye, Other Reaction: Other reaction Uncoded Allergy. Allergen: guaffenisin Uncoded Allergy. Allergen: peanuts, Other Reaction: Other reaction KY jelly   . Peanut Oil   . Wheat Bran  Prior to Admission medications   Medication Sig Start Date End Date Taking? Authorizing Provider  Ascorbic Acid (VITAMIN C) 500 MG CAPS Take 500 mg by mouth daily as needed.   Yes [provider]  Cholecalciferol (VITAMIN D3) 1.25 MG (50000 UT) CAPS Vitamin D3  500 IU daily   Yes [provider]  EPINEPHrine 0.3 mg/0.3 mL IJ SOAJ injection AS DIRECTED IF LIFE THREATENING REACTION OCCURS INJECTION 08/11/19  Yes [provider]  triamcinolone cream (KENALOG) 0.1 % APPLY TO AFFECTED AREA TWICE A DAY AS NEEDED 08/11/19  Yes [provider]  vitamin B-12 (CYANOCOBALAMIN) 100 MCG tablet Vitamin B-12  daily   Yes [provider]  Zinc Acetate-Sweetleaf (FP ZINC LOZENGES PO) Take by mouth.   Yes [provider]     Positive ROS: Otherwise negative  All other systems have been reviewed and were  otherwise negative with the exception of those mentioned in the HPI and as above.  Physical Exam: Constitutional: Alert, well-appearing, no acute distress Ears: External ears without lesions or tenderness.  She had a moderate amount of wax obstructing 50 to 60% of the ear canals bilaterally that was cleaned with suction.  Both TMs were clear bilaterally.  She developed some dizziness after placing hydroperoxide drops in the left ear but this cleared within a few minutes.  Right ear canal was also cleaned with suction but did not use any hydrogen peroxide.  Right ear canal was also 50 to 60% obstructed with cerumen.  Both TMs were clear with good mobility pneumatic otoscopy and she heard well in both ears on tuning fork testing.  On Dix-Hallpike testing there was no evidence of nystagmus or vertigo. Nasal: External nose without lesions. Clear nasal passages Oral: Lips and gums without lesions. Tongue and palate mucosa without lesions. Posterior oropharynx clear. Neck: No palpable adenopathy or masses Respiratory: Breathing comfortably  Skin: No facial/neck lesions or rash noted.  Cerumen impaction removal  Date/Time: 06/11/2020 4:59 PM Performed by: Rozetta Nunnery, MD Authorized by: Rozetta Nunnery, MD   Consent:    Consent obtained:  Verbal   Consent given by:  Patient   Risks discussed:  Pain and bleeding Procedure details:    Location:  L ear and R ear   Procedure type: suction   Post-procedure details:    Inspection:  TM intact and canal normal   Hearing quality:  Improved   Patient tolerance of procedure:  Tolerated well, no immediate complications Comments:     TMs are clear bilaterally.    Assessment: Bilateral cerumen impactions.  Plan: Ear canals were cleaned in the office.. Patient has had recent dizziness but presently no evidence of BPPV.   Radene Journey, MD   CC:

## 2020-11-29 ENCOUNTER — Ambulatory Visit: Payer: Commercial Managed Care - PPO | Attending: Internal Medicine

## 2020-11-29 DIAGNOSIS — Z23 Encounter for immunization: Secondary | ICD-10-CM

## 2020-11-29 NOTE — Progress Notes (Signed)
   Covid-19 Vaccination Clinic  Name:  Margaret Franco    MRN: [id number] DOB: 08-02-1966  11/29/2020  Ms. Margaret Franco was observed post Covid-19 immunization for 30 minutes based on pre-vaccination screening without incident. She was provided with Vaccine Information Sheet and instruction to access the V-Safe system.   Ms. Margaret Franco was instructed to call 911 with any severe reactions post vaccine: Marland Kitchen Difficulty breathing  . Swelling of face and throat  . A fast heartbeat  . A bad rash all over body  . Dizziness and weakness   Immunizations Administered    No immunizations on file.

## 2021-05-13 ENCOUNTER — Other Ambulatory Visit: Payer: Self-pay | Admitting: Family Medicine

## 2021-05-13 DIAGNOSIS — R160 Hepatomegaly, not elsewhere classified: Secondary | ICD-10-CM

## 2021-06-03 ENCOUNTER — Ambulatory Visit
Admission: RE | Admit: 2021-06-03 | Discharge: 2021-06-03 | Disposition: A | Discharge status: Home / Self Care | Payer: Commercial Managed Care - PPO | Source: Ambulatory Visit | Attending: Family Medicine | Admitting: Family Medicine

## 2021-06-03 DIAGNOSIS — R160 Hepatomegaly, not elsewhere classified: Secondary | ICD-10-CM

## 2021-06-03 IMAGING — US US ABDOMEN COMPLETE
1 series · 13 of 25 positions shown · non-contrast
Comparison: Right upper quadrant ultrasound [DATE]. Remote
ultrasound [DATE], remote CT [DATE]

CLINICAL DATA: Hepatomegaly. Right flank pain. History of kidney
tumor and recurrent UTIs.

EXAM:
ABDOMEN ULTRASOUND COMPLETE

[Series 1: us abdomen complete · 0.15mm/px · 13 of 101 slices shown]
[im 1/101]
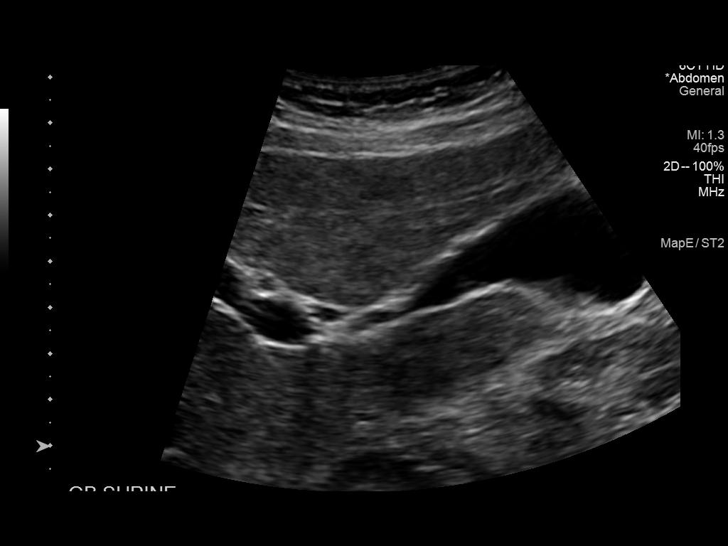
[im 9/101]
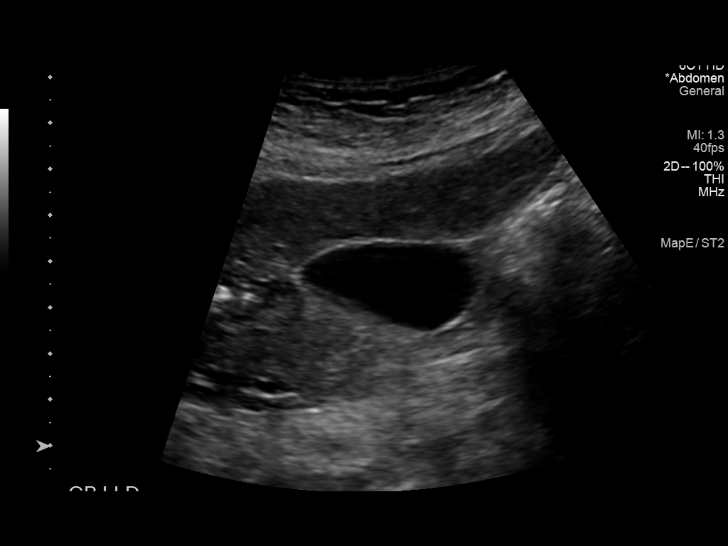
[im 17/101]
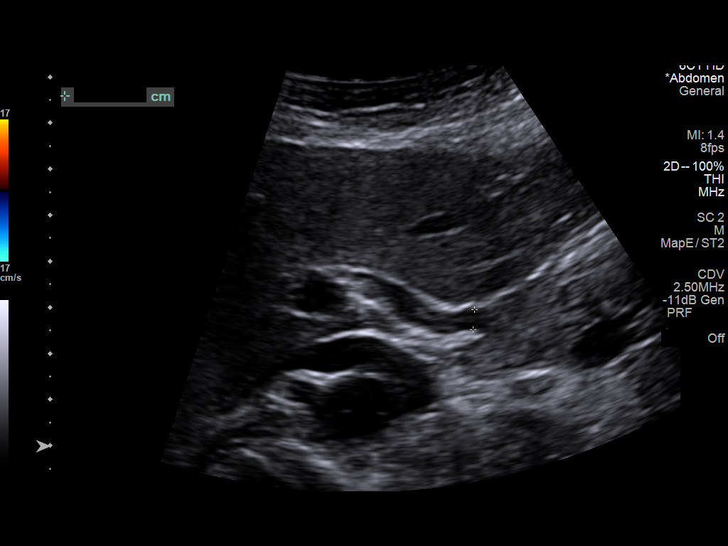
[im 26/101]
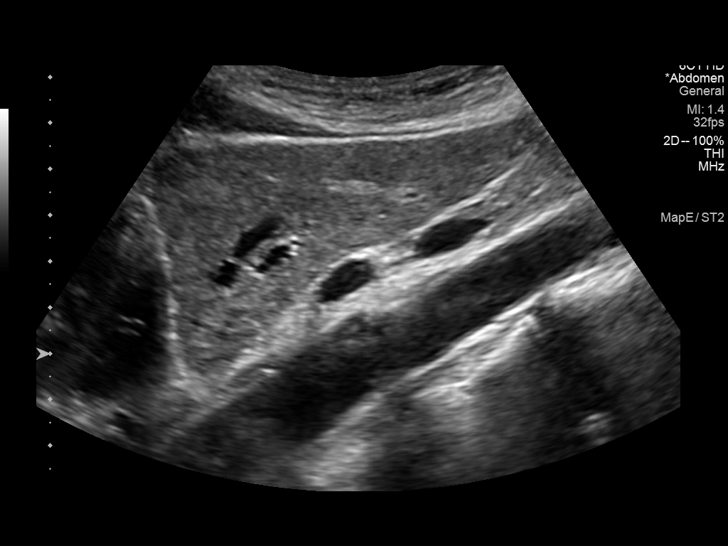
[im 34/101]
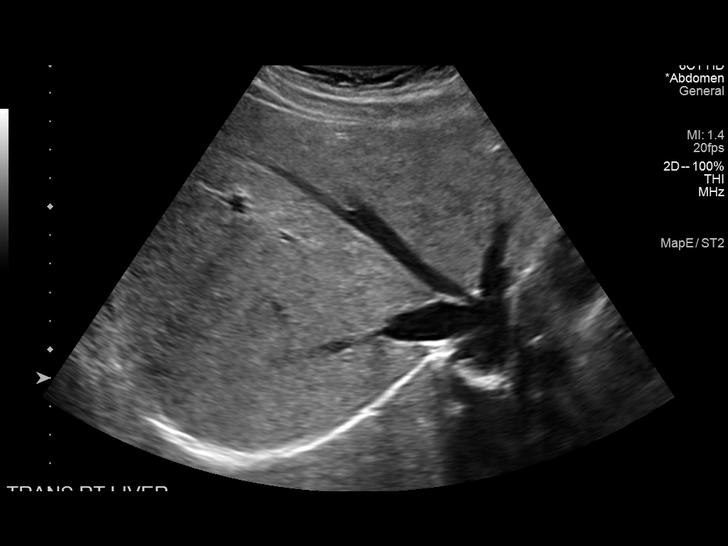
[im 42/101]
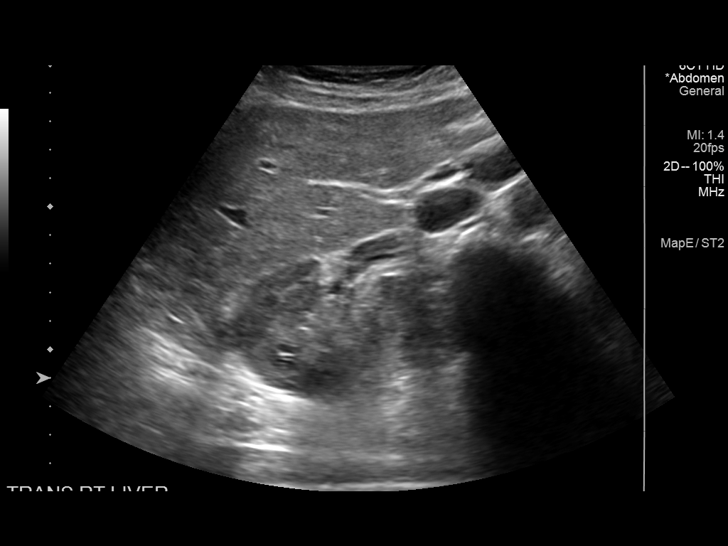
[im 51/101]
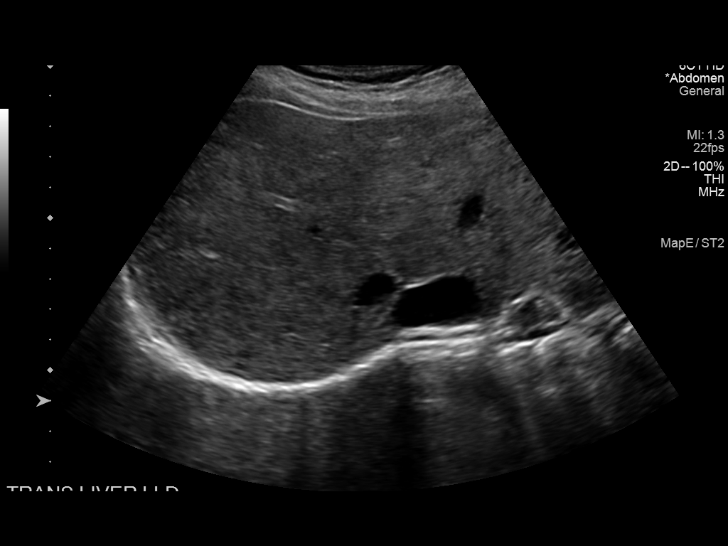
[im 59/101]
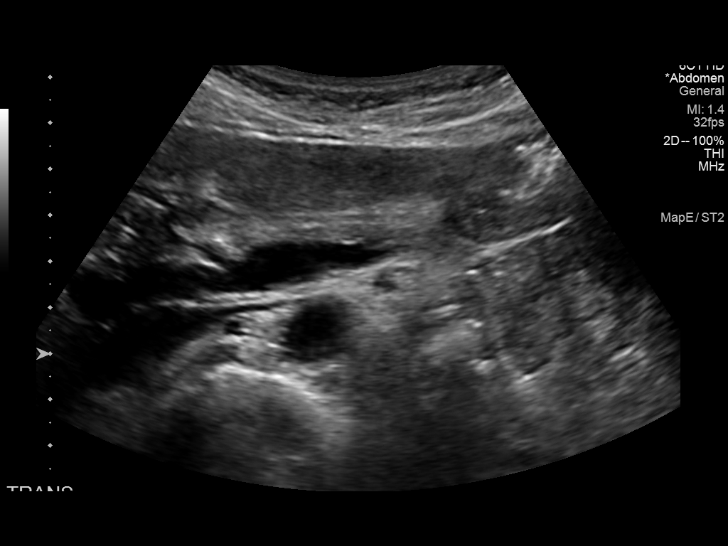
[im 67/101]
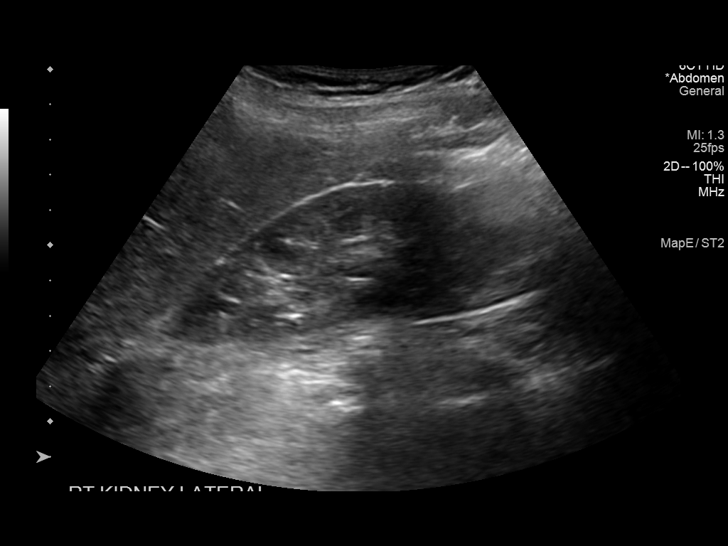
[im 76/101]
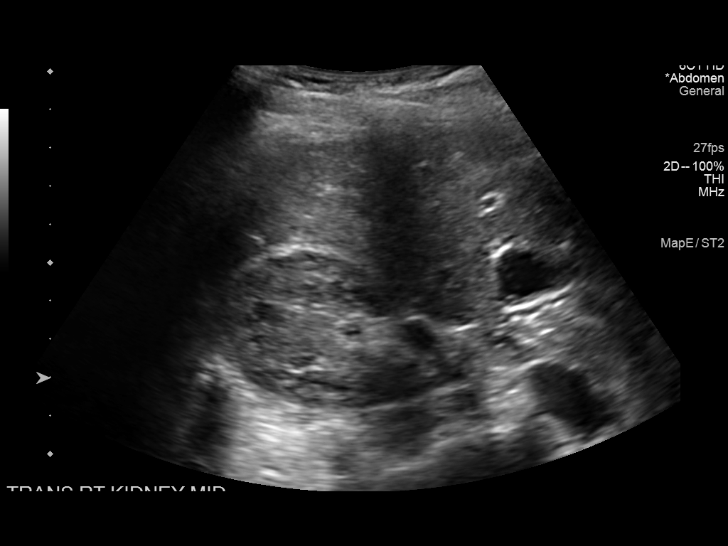
[im 84/101]
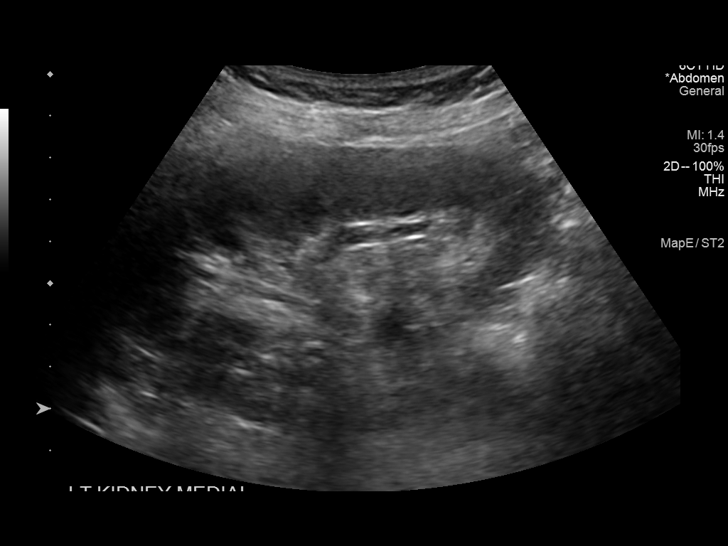
[im 92/101]
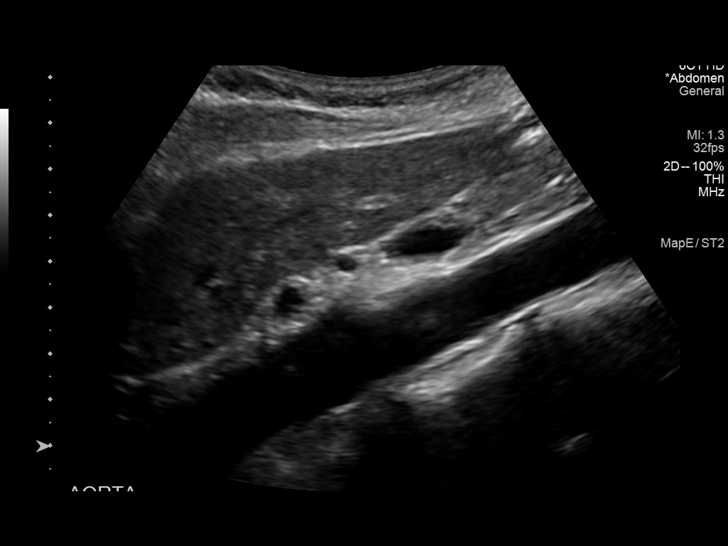
[im 101/101]
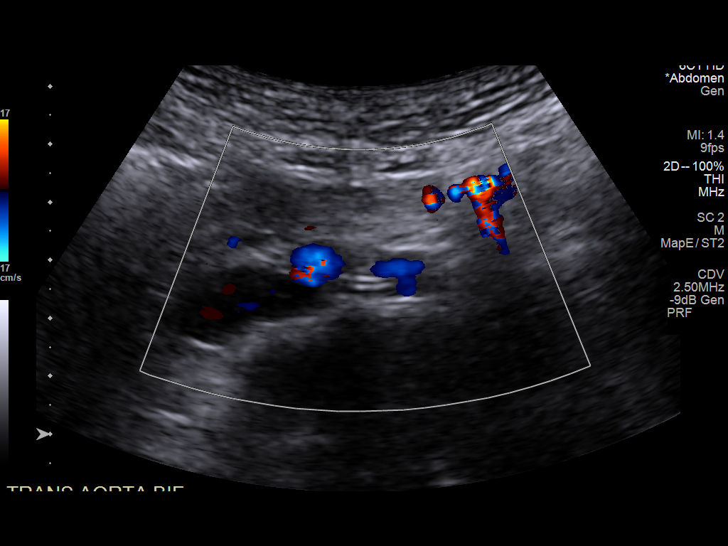

[13 of 25 positions shown; findings below may reference images not displayed]

FINDINGS: Gallbladder: Physiologically distended. No gallstones or wall
thickening visualized. No sonographic Murphy sign noted by
sonographer.

Common bile duct: Diameter: 4 mm, normal.

Liver: No focal lesion identified. Mildly increased in parenchymal
echogenicity. The liver was not discretely measured by technologist,
my measurements of the right lobe of 14-16 cm is within normal
limits. Portal vein is patent on color Doppler imaging with normal
direction of blood flow towards the liver.

IVC: No abnormality visualized.

Pancreas: Visualized portion unremarkable.

Spleen: Size and appearance within normal limits.

Right Kidney: Length: 11.8 cm. Echogenicity within normal limits. No
mass or hydronephrosis visualized.

Left Kidney: Length: 10.7 cm. Dilatation of the left renal pelvis
without significant calyceal dilatation. No evidence of focal lesion
or stone.

Abdominal aorta: No aneurysm visualized.

Other findings: No abdominal ascites. Technically challenging exam
due to habitus and bowel gas.
IMPRESSION: 1. Findings suggestive of mild hepatic steatosis. The liver is not
discretely measured on the current exam, however appears normal in
size by my measurements on provided images.
2. Normal sonographic appearance of the gallbladder and biliary
tree. Unremarkable sonographic appearance of the right kidney. No
evidence of focal renal lesion.
3. Dilatation of the left renal pelvis without definite calyceal
dilatation. This is favored to represent extrarenal pelvis
configuration, and was present on prior CT of [DATE].

## 2021-06-25 ENCOUNTER — Other Ambulatory Visit: Payer: Self-pay | Admitting: Family Medicine

## 2021-06-25 ENCOUNTER — Ambulatory Visit
Admission: RE | Admit: 2021-06-25 | Discharge: 2021-06-25 | Disposition: A | Discharge status: Home / Self Care | Payer: Commercial Managed Care - PPO | Source: Ambulatory Visit | Attending: Family Medicine | Admitting: Family Medicine

## 2021-06-25 DIAGNOSIS — R109 Unspecified abdominal pain: Secondary | ICD-10-CM

## 2021-06-25 IMAGING — DX DG CHEST 2V
2 series · 2 of 2 positions shown · non-contrast
Comparison: [DATE]

CLINICAL DATA: 55-year-old female with right flank pain

EXAM:
CHEST - 2 VIEW

[dg chest 2 view (1 of 2)]
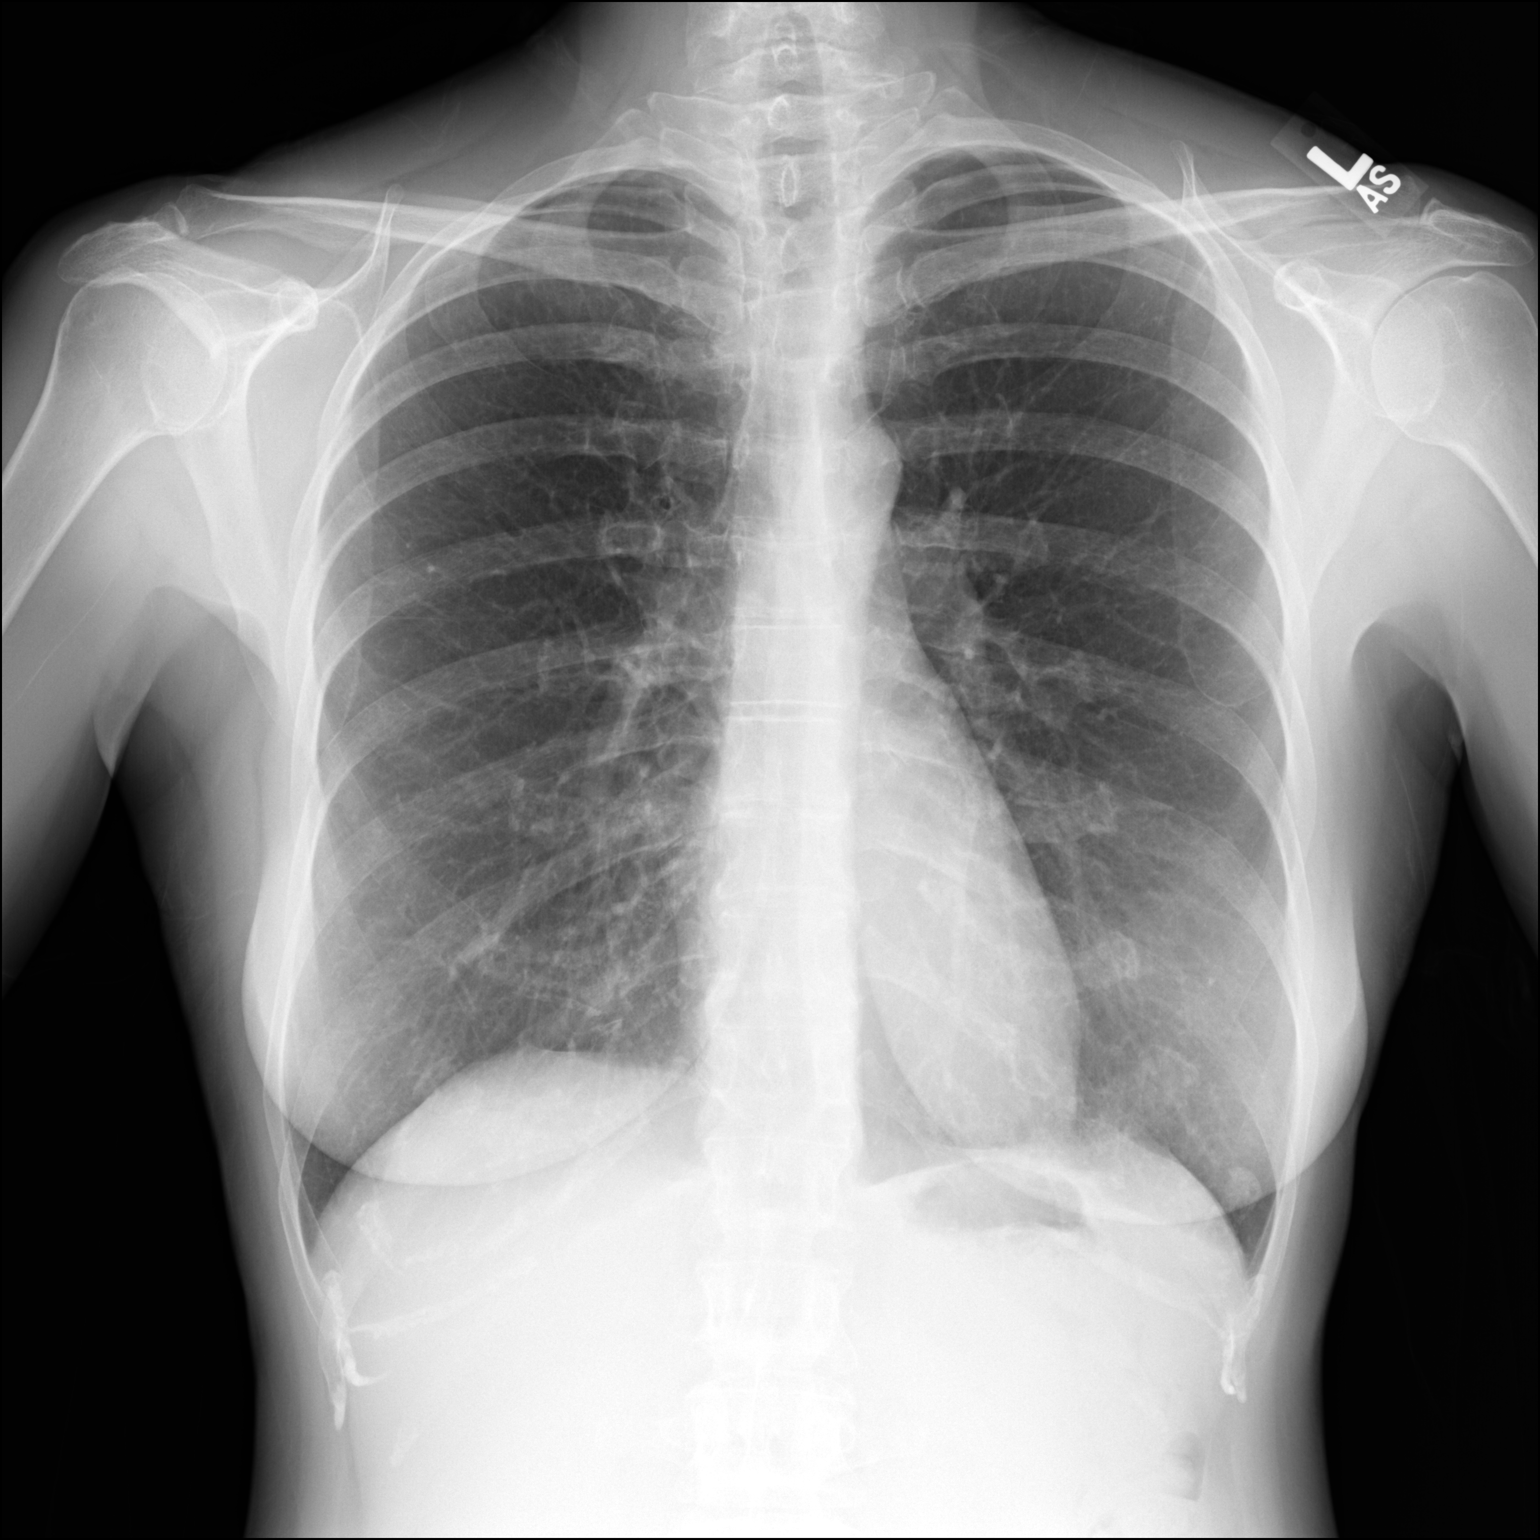

[dg chest 2 view (2 of 2)]
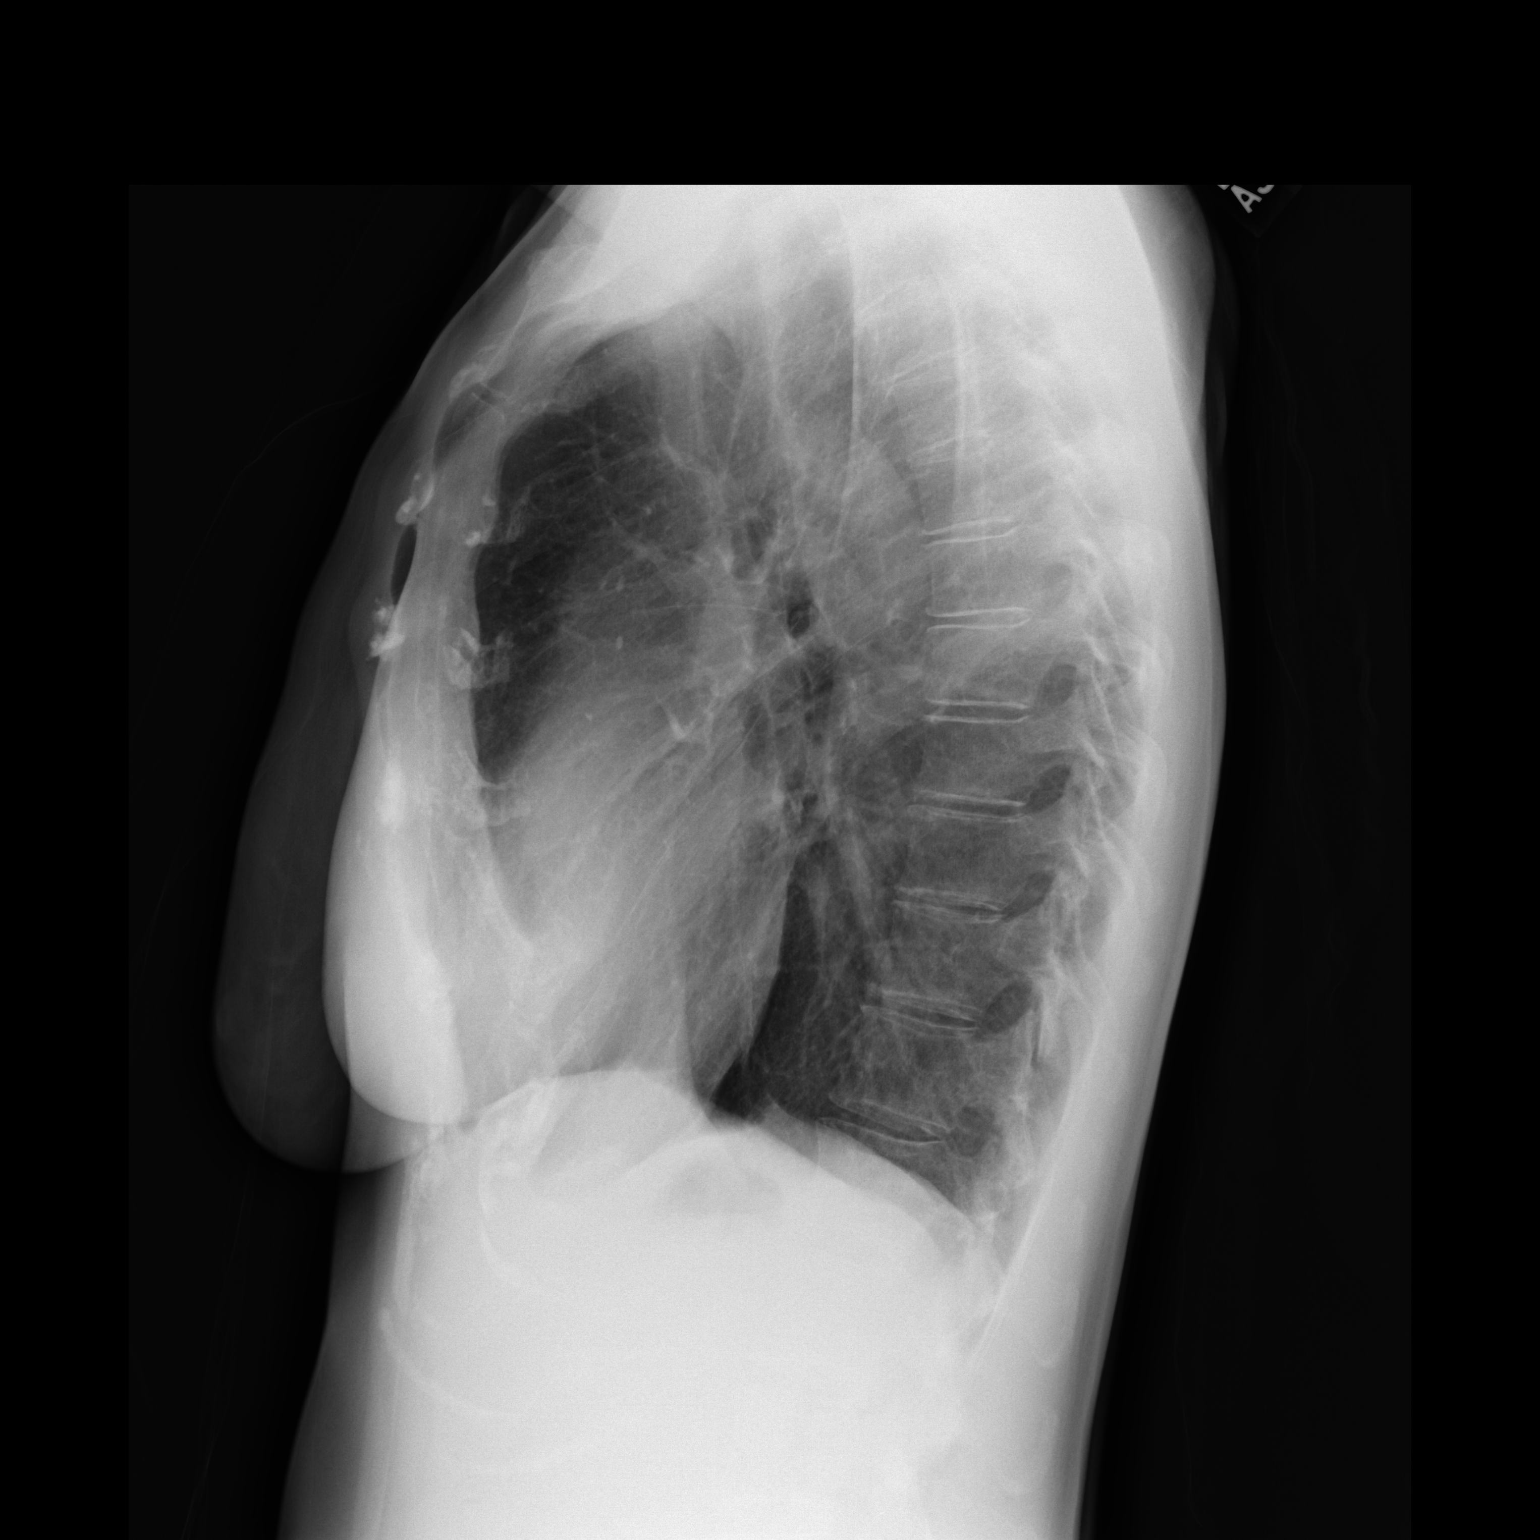

[2 of 2 positions shown; findings below may reference images not displayed]

FINDINGS: Cardiomediastinal silhouette unchanged in size and contour. No
evidence of central vascular congestion. No interlobular septal
thickening.

Nodular density at the left costophrenic angle is unchanged, likely
calcified granuloma.

No pneumothorax or pleural effusion. Coarsened interstitial
markings, with no confluent airspace disease.

No acute displaced fracture. No significant degenerative changes of
the spine.
IMPRESSION: Negative for acute cardiopulmonary disease

## 2021-07-29 ENCOUNTER — Other Ambulatory Visit: Payer: Self-pay | Admitting: Family Medicine

## 2021-07-29 DIAGNOSIS — G4484 Primary exertional headache: Secondary | ICD-10-CM

## 2021-08-12 ENCOUNTER — Other Ambulatory Visit: Payer: Self-pay

## 2021-08-12 ENCOUNTER — Ambulatory Visit
Admission: RE | Admit: 2021-08-12 | Discharge: 2021-08-12 | Disposition: A | Discharge status: Home / Self Care | Payer: Commercial Managed Care - PPO | Source: Ambulatory Visit | Attending: Family Medicine | Admitting: Family Medicine

## 2021-08-12 DIAGNOSIS — G4484 Primary exertional headache: Secondary | ICD-10-CM

## 2021-08-12 IMAGING — MR MR MRA HEAD W/O CM
1 series · 23 of 48 positions shown · non-contrast
Comparison: No pertinent prior exam.

CLINICAL DATA: Exertional headaches

EXAM:
MRA HEAD WITHOUT CONTRAST
TECHNIQUE: Angiographic images of the Circle of Willis were acquired using MRA
technique without intravenous contrast.

[Series 3: tof_3d_multi-slab new · axial · 0.7mm · 0.35mm/px · z∈[+0,+78]mm · 23 of 117 slices shown]
[im 1/117]
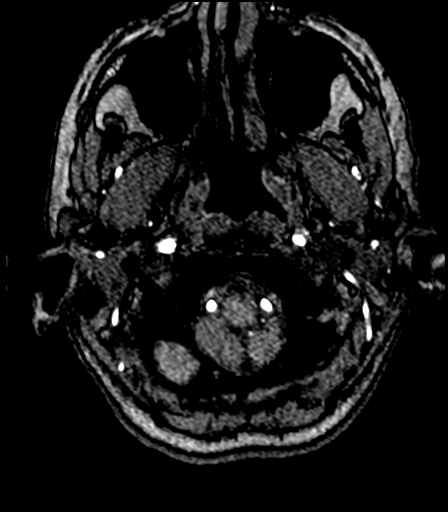
[im 3/117]
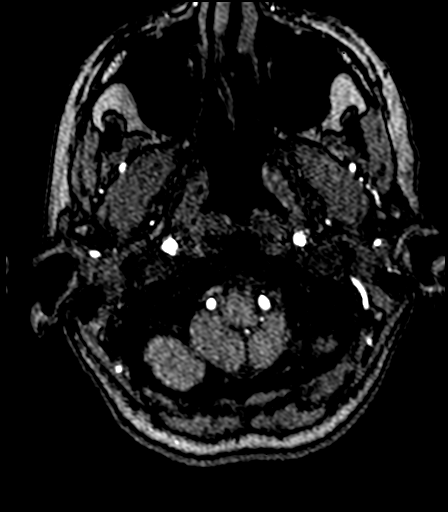
[im 5/117]
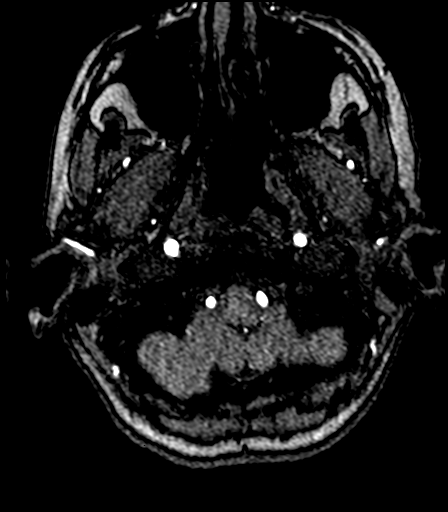
[im 8/117]
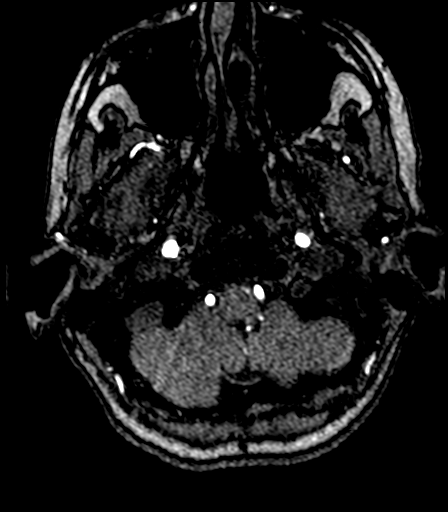
[im 10/117]
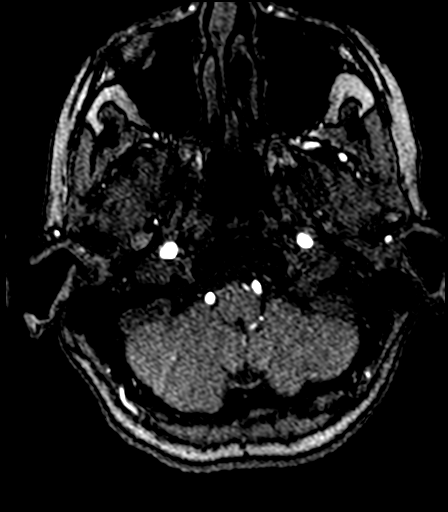
[im 13/117]
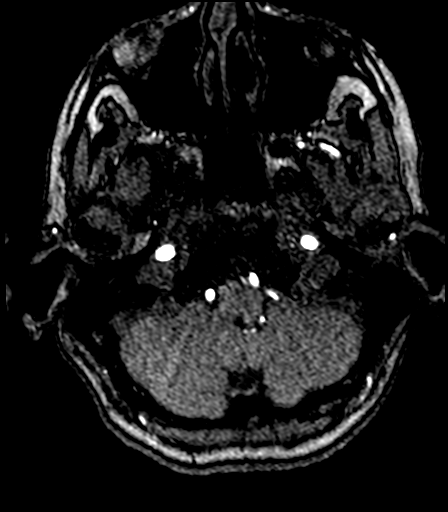
[im 15/117]
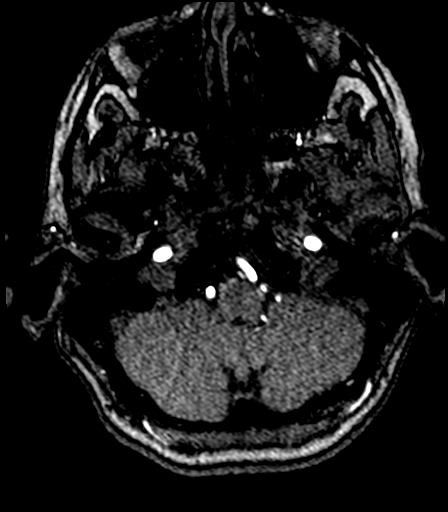
[im 18/117]
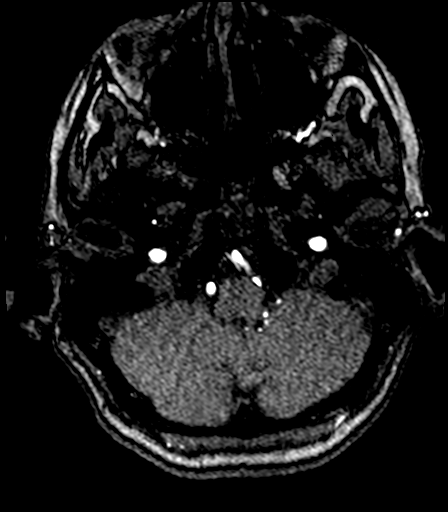
[im 20/117]
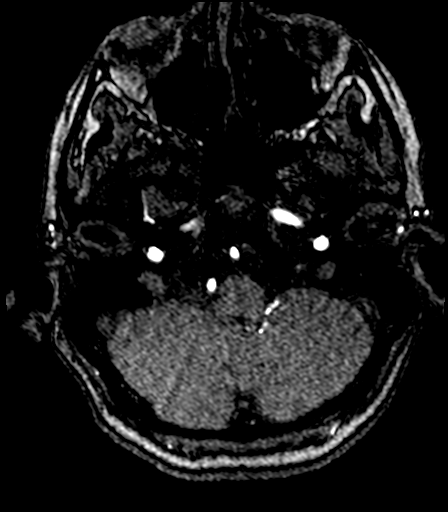
[im 23/117]
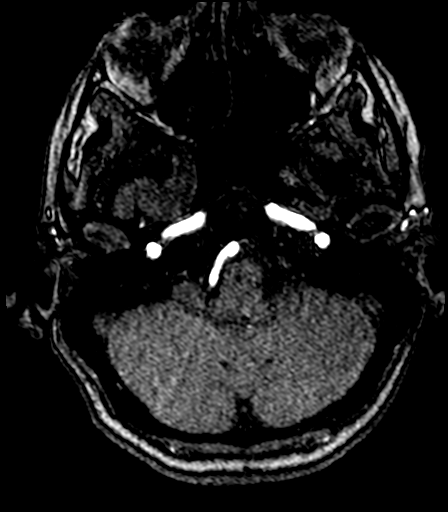
[im 25/117]
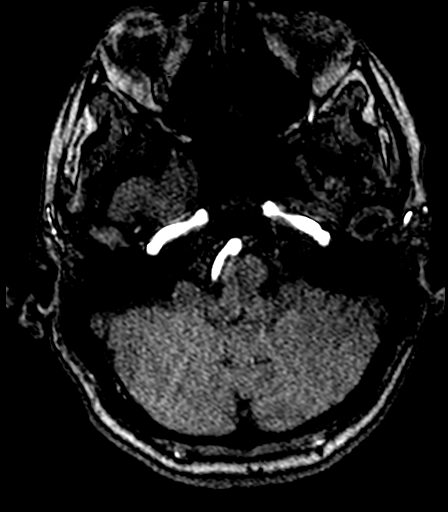
[im 28/117]
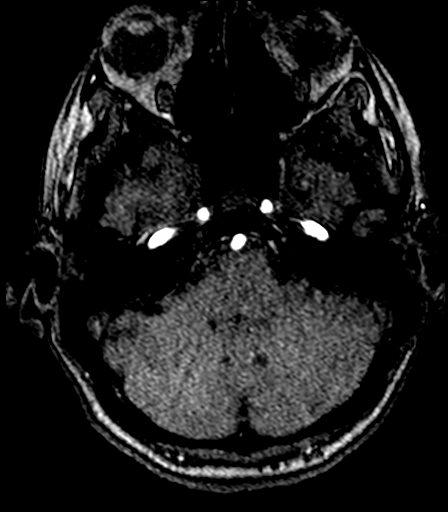
[im 30/117]
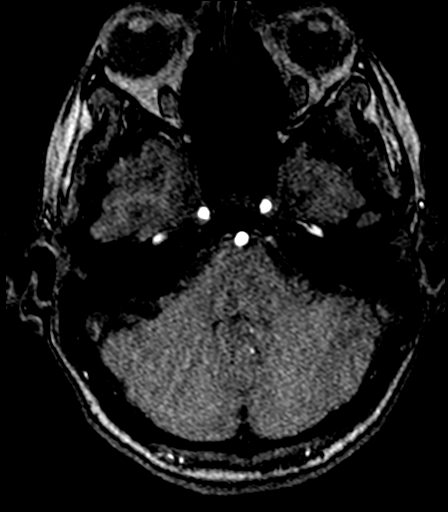
[im 33/117]
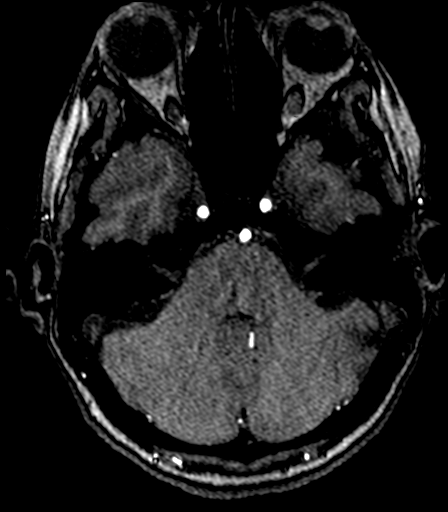
[im 35/117]
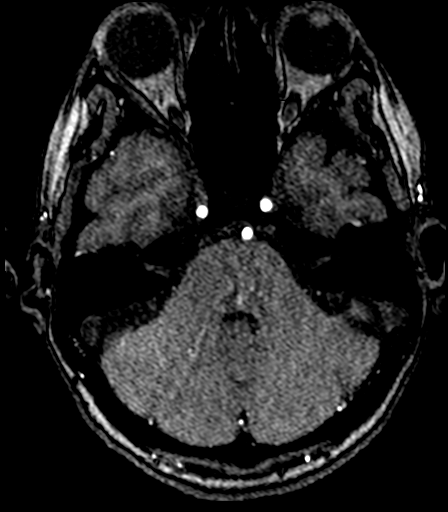
[im 38/117]
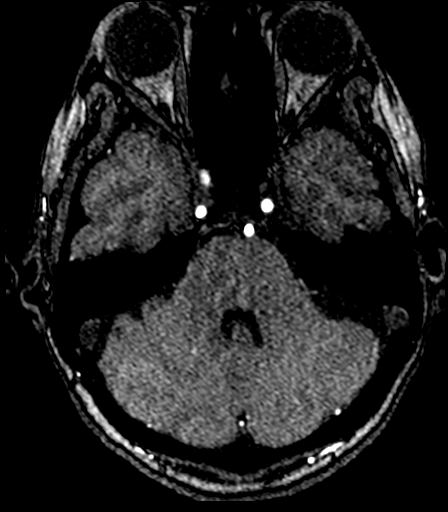
[im 52/117]
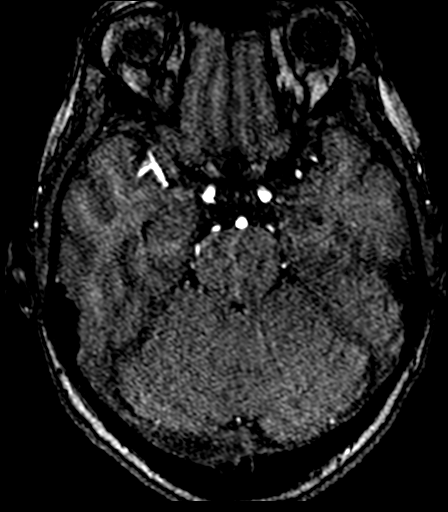
[im 60/117]
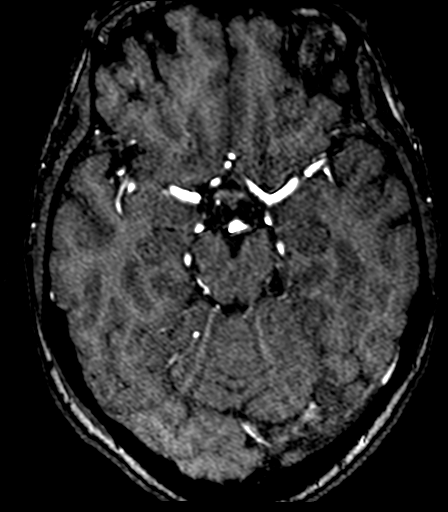
[im 67/117]
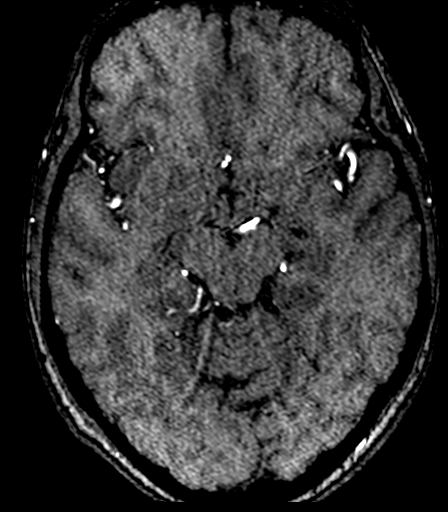
[im 82/117]
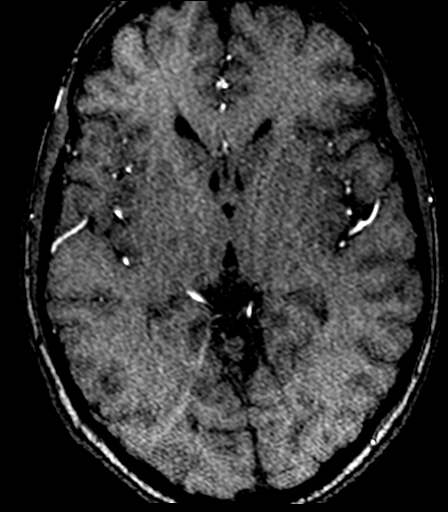
[im 97/117]
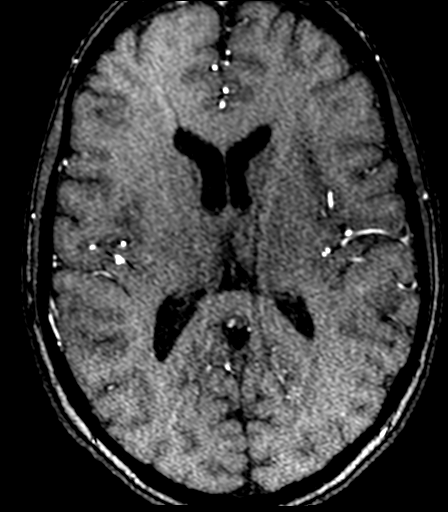
[im 99/117]
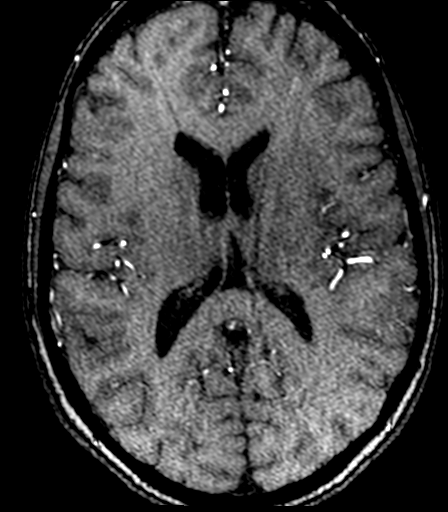
[im 112/117]
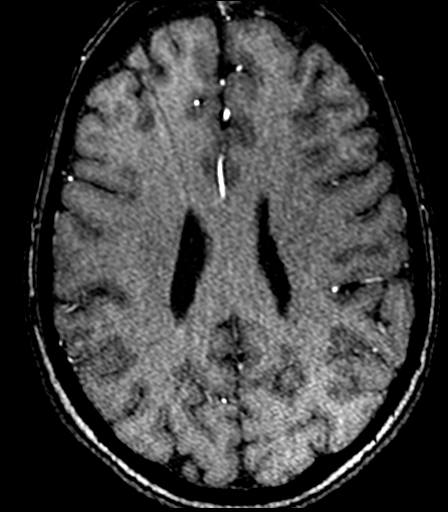

[23 of 48 positions shown; findings below may reference images not displayed]

FINDINGS: Anterior circulation: The ACAs, MCAs, and intracranial ICAs are
patent without significant stenosis or aneurysm.

Posterior circulation: The intracranial vertebral arteries, basilar
artery, superior cerebellar arteries, and PCAs are patent, without
significant stenosis or aneurysm. The right posterior communicating
artery is visualized. The left posterior communicating artery is not
visualized.

Anatomic variants: None significantw

Other: None.
IMPRESSION: Normal MRA.  No significant stenosis or aneurysm.

## 2021-10-14 ENCOUNTER — Other Ambulatory Visit: Payer: Self-pay | Admitting: Obstetrics and Gynecology

## 2021-10-14 DIAGNOSIS — R102 Pelvic and perineal pain: Secondary | ICD-10-CM

## 2021-10-15 ENCOUNTER — Other Ambulatory Visit: Payer: Self-pay | Admitting: Obstetrics and Gynecology

## 2021-10-15 NOTE — Progress Notes (Signed)
Sent message, via epic in basket, requesting orders in epic from surgeon.  

## 2021-10-16 ENCOUNTER — Ambulatory Visit
Admission: RE | Admit: 2021-10-16 | Discharge: 2021-10-16 | Disposition: A | Discharge status: Home / Self Care | Payer: Commercial Managed Care - PPO | Source: Ambulatory Visit | Attending: Obstetrics and Gynecology | Admitting: Obstetrics and Gynecology

## 2021-10-16 ENCOUNTER — Other Ambulatory Visit: Payer: Self-pay | Admitting: Obstetrics and Gynecology

## 2021-10-16 ENCOUNTER — Other Ambulatory Visit: Payer: Self-pay

## 2021-10-16 DIAGNOSIS — R102 Pelvic and perineal pain: Secondary | ICD-10-CM

## 2021-10-16 IMAGING — MR MR PELVIS WO/W CM
21 of 22 series · 45 of 48 positions shown · IV contrast (multihance)
Comparison: None.

CLINICAL DATA: Perineum pain. Concern for symptomatic degenerating
fibroid.

EXAM:
MRI PELVIS WITHOUT AND WITH CONTRAST
TECHNIQUE: Multiplanar multisequence MR imaging of the pelvis was performed
both before and after administration of intravenous contrast.
CONTRAST:  12mL MULTIHANCE GADOBENATE DIMEGLUMINE 529 MG/ML IV SOLN

[Series 2: T2 · coronal · 5.0mm · 1.76mm/px · 1 of 36 slices shown (1 of 6)]
[im 1/36]
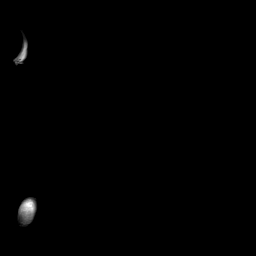

[Series 3: T2 · coronal · 5.0mm · 1.25mm/px · 1 of 30 slices shown (2 of 6)]
[im 1/30]
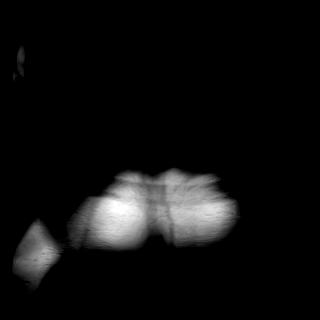

[Series 4: T2 · axial · 5.0mm · 0.41mm/px · 1 of 34 slices shown (3 of 6)]
[im 1/34]
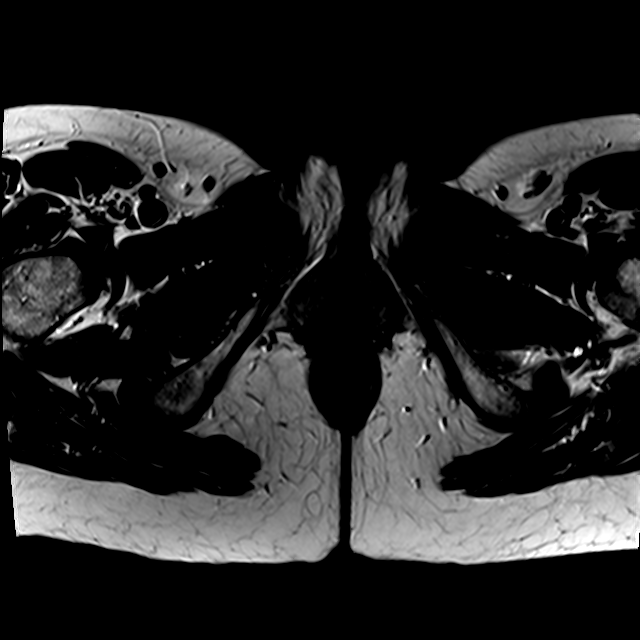

[Series 5: T2 fat-sat · axial · 5.0mm · 0.41mm/px · 1 of 34 slices shown]
[im 1/34]
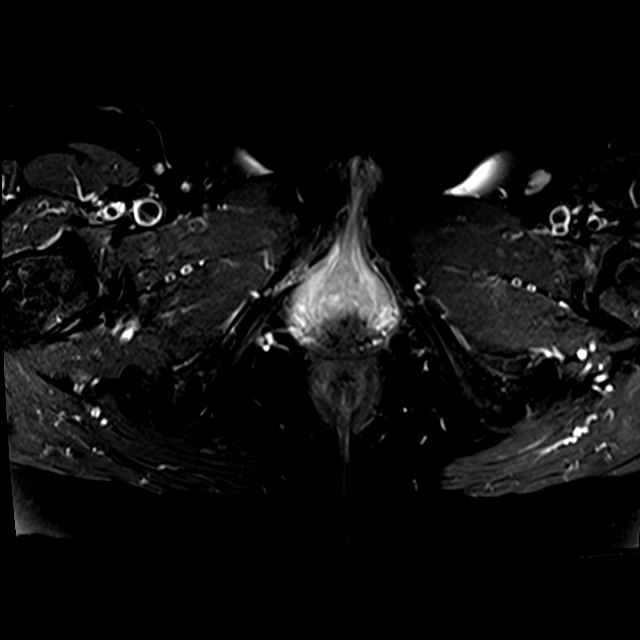

[Series 6: T2 · sagittal · 5.0mm · 0.78mm/px · 1 of 27 slices shown (4 of 6)]
[im 1/27]
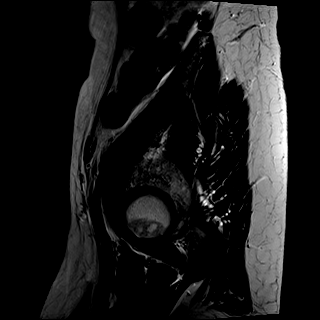

[Series 7: T2 · coronal · 5.0mm · 0.81mm/px · 1 of 30 slices shown (5 of 6)]
[im 1/30]
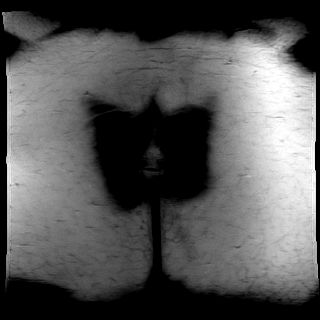

[Series 8: ax dwi_tracew_dfc_mix · axial · 5.0mm · 1.19mm/px · z∈[-202,+8]mm · 2 of 108 slices shown]
[im 1/108]
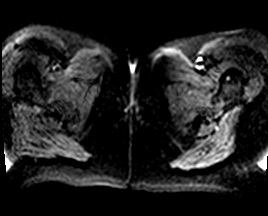
[im 108/108]
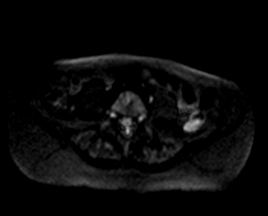

[Series 9: ax dwi_adc_dfc_mix · axial · 5.0mm · 1.19mm/px · 1 of 36 slices shown]
[im 1/36]
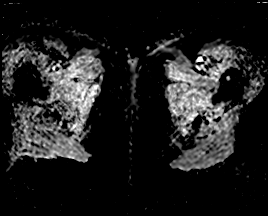

[Series 10: T1 · axial · 5.0mm · 1.25mm/px · z∈[-217,+18]mm · 2 of 96 slices shown]
[im 1/96]
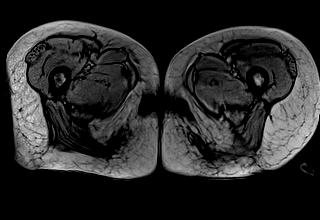
[im 96/96]
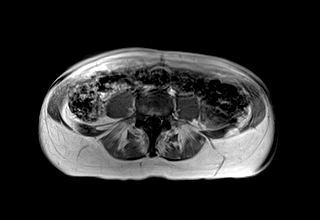

[Series 11: T2 · axial · 6.0mm · 1.19mm/px · 1 of 30 slices shown (6 of 6)]
[im 1/30]
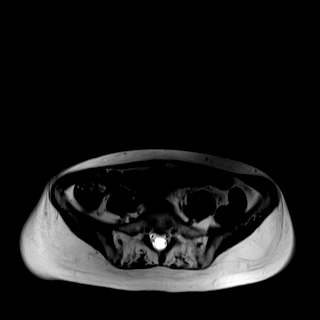

[Series 12: T1 dynamic · axial · non-contrast · 3.0mm · 1.25mm/px · z∈[-233,+4]mm · 2 of 80 slices shown]
[im 1/80]
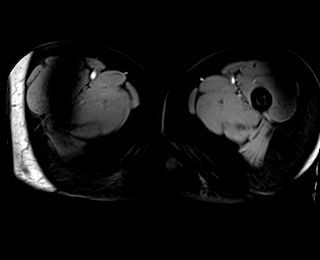
[im 80/80]
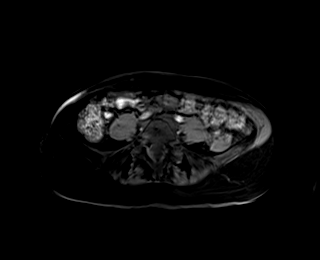

[Series 13: T1 dynamic post-contrast · axial · 3.0mm · 1.25mm/px · z∈[-233,+4]mm · 2 of 80 slices shown (1 of 8)]
[im 1/80]
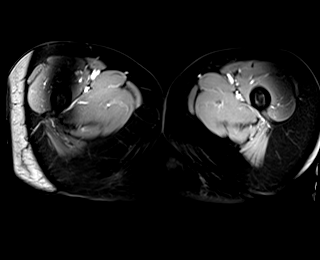
[im 80/80]
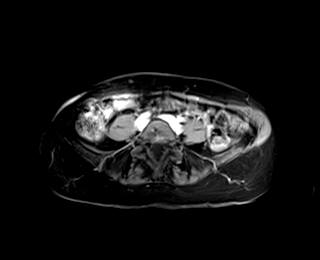

[Series 14: T1 dynamic post-contrast · axial · 3.0mm · 1.25mm/px · z∈[-233,+4]mm · 2 of 80 slices shown (2 of 8)]
[im 1/80]
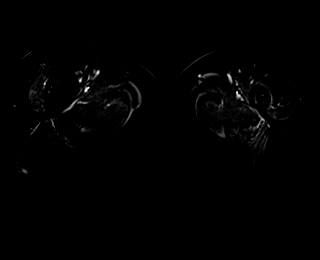
[im 80/80]
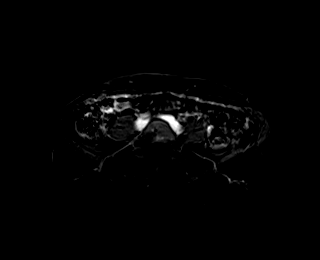

[Series 15: T1 dynamic post-contrast · axial · 3.0mm · 1.25mm/px · z∈[-233,+4]mm · 3 of 80 slices shown (3 of 8)]
[im 1/80]
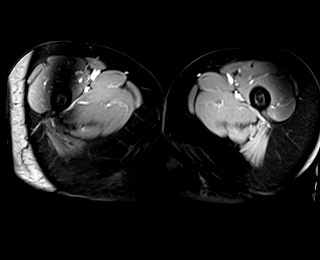
[im 40/80]
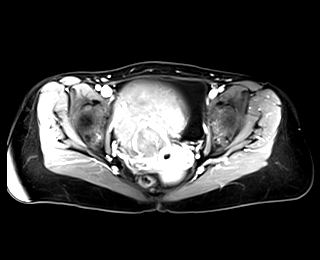
[im 80/80]
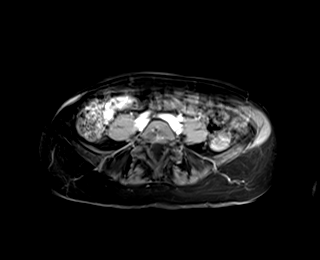

[Series 16: T1 dynamic post-contrast · axial · 3.0mm · 1.25mm/px · z∈[-233,+4]mm · 3 of 80 slices shown (4 of 8)]
[im 1/80]
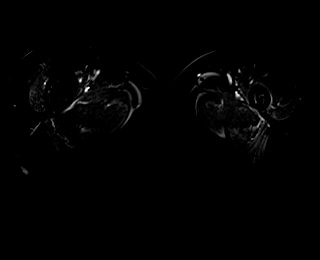
[im 40/80]
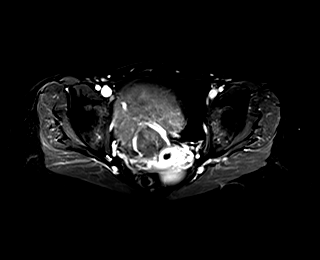
[im 80/80]
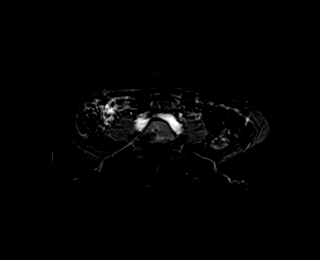

[Series 17: T1 dynamic post-contrast · axial · 3.0mm · 1.25mm/px · z∈[-233,+4]mm · 3 of 80 slices shown (5 of 8)]
[im 1/80]
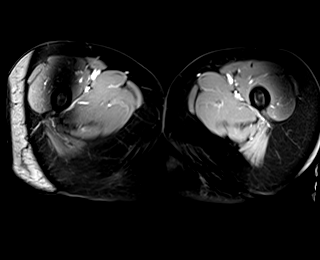
[im 40/80]
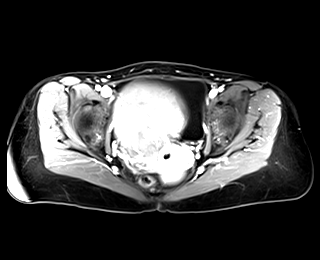
[im 80/80]
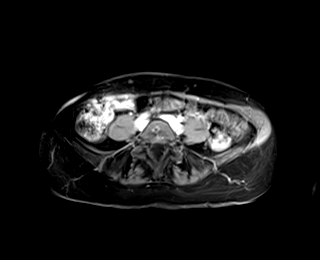

[Series 18: T1 dynamic post-contrast · axial · 3.0mm · 1.25mm/px · z∈[-233,+4]mm · 3 of 80 slices shown (6 of 8)]
[im 1/80]
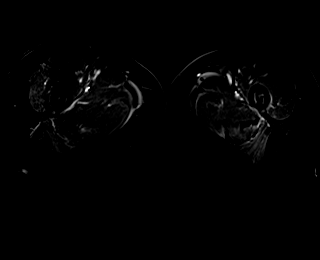
[im 40/80]
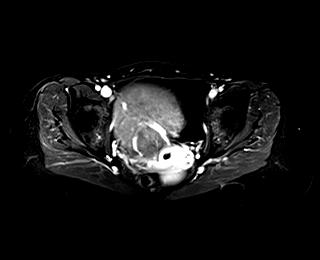
[im 80/80]
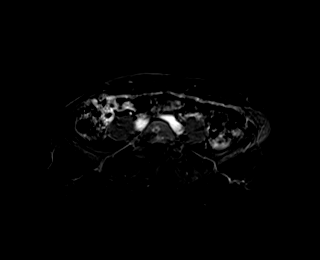

[Series 19: T1 dynamic post-contrast · axial · 3.0mm · 1.25mm/px · z∈[-233,+4]mm · 3 of 80 slices shown (7 of 8)]
[im 1/80]
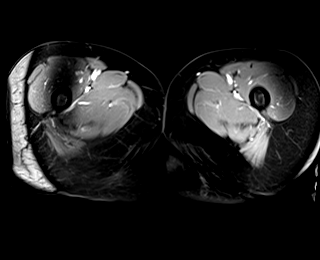
[im 40/80]
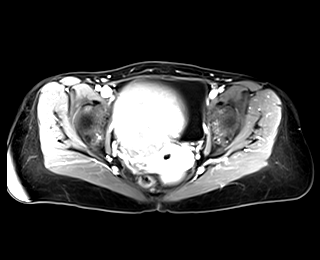
[im 80/80]
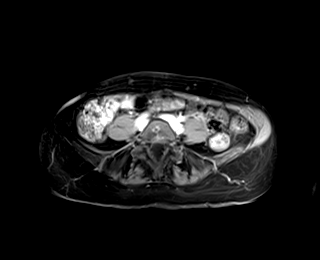

[Series 20: T1 dynamic post-contrast · axial · 3.0mm · 1.25mm/px · z∈[-233,+4]mm · 3 of 80 slices shown (8 of 8)]
[im 1/80]
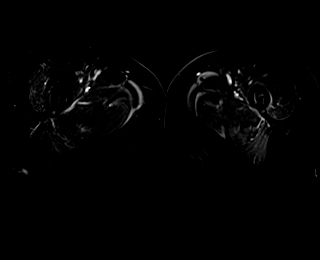
[im 40/80]
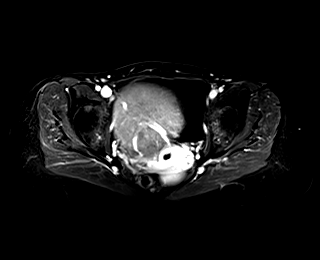
[im 80/80]
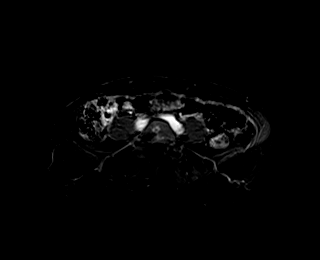

[Series 21: T1 fat-sat post-contrast · axial · 1.2mm · 0.75mm/px · z∈[-211,-1]mm · 6 of 176 slices shown]
[im 1/176]
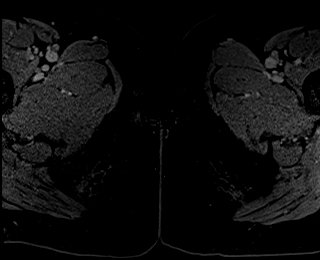
[im 36/176]
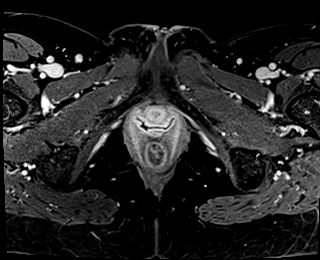
[im 71/176]
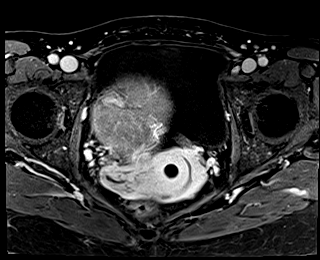
[im 106/176]
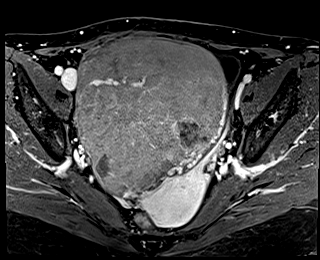
[im 141/176]
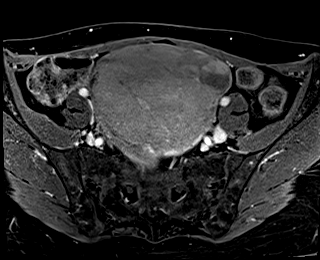
[im 176/176]
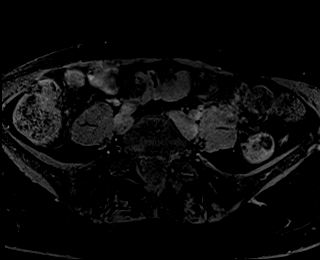

[Series 22: T1 fat-sat · sagittal · 1.2mm · 0.88mm/px · 3 of 144 slices shown]
[im 1/144]
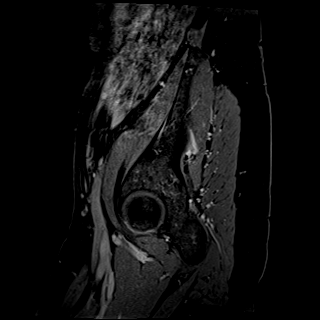
[im 36/144]
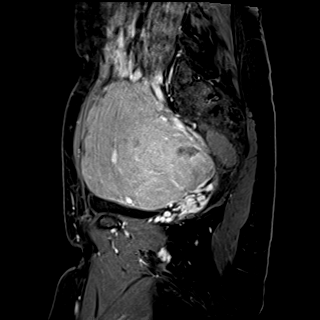
[im 72/144]
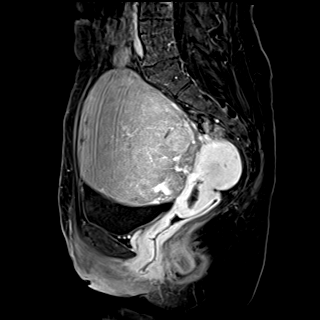

[45 of 48 positions shown; findings below may reference images not displayed]

FINDINGS: Urinary Tract:  No abnormality visualized.

Bowel:  Unremarkable visualized pelvic bowel loops.

Vascular/Lymphatic: No pathologically enlarged lymph nodes. No
significant vascular abnormality seen.

Reproductive: Uterus: Measures 14.2 x 11.8 by 11.3 cm, volume equals
947 cc. There is a very large subserosal fibroid arising off the
right anterior myometrium measuring 12.2 by 11.2 by 11.4 cm. This is
predominantly T2 hyperintense with linear internal areas of
increased T2. Following the IV administration of contrast material
there is internal enhancement throughout most of the mass with a few
areas of relative hypoenhancement a along the posterior aspect which
may represent areas of degeneration, image 30/13 and image 33/13.
The largest area of hypoenhancement measures 2.5 cm, image 32/18. No
cystic internal change identified at this time.

Endometrium: Measures 4 mm, image [DATE]. Increased caliber of the
cervical canal measures 1 cm, image [DATE].

Right ovary: Difficult to visualize.

Left ovary: Measures 2.3 x 1.0 by 2.6 cm (volume equals 5.98), image
[DATE] and image [DATE]. No adnexal mass.

Other: Trace fluid noted within the dependent portion of the pelvis.

Musculoskeletal: No suspicious bone lesions identified.
IMPRESSION: 1. There is a very large subserosal fibroid arising off the right
anterior myometrium measuring 12.2 by 11.2 by 11.4 cm. This is
predominantly T2 hyperintense with internal enhancement throughout
most of the mass with a few areas of relative hypoenhancement along
the posterior aspect which may represent areas of degeneration. No
cystic internal change identified at this time.
2. Increased caliber of the cervical canal measures 1 cm. This may
reflect mass effect secondary to large fibroid.
3. Trace fluid noted within the dependent portion of the pelvis.

## 2021-10-16 MED ORDER — GADOBENATE DIMEGLUMINE 529 MG/ML IV SOLN
12.0000 mL | Freq: Once | INTRAVENOUS | Status: AC | PRN
  Administered 2021-10-16: 12 mL via INTRAVENOUS

## 2021-10-16 NOTE — Progress Notes (Addendum)
Anesthesia Review:  PCP: DR Caren Griffins white - Med Records to send last 3 OV notes.  10/02/21 OV note, 08/20/21 - televisit note, 08/11/21 ov note on chart  Cardiologist : saw one years ago for pvc-  LOV 07/04/14 with DR Candee Furbish  Chest x-ray : 2V - 06/25/21  EKG :10/20/21  Echo : Stress test: Cardiac Cath :  Activity level: can do a flight of stairs without difficulty  Sleep Study/ CPAP : none  Fasting Blood Sugar :      / Checks Blood Sugar -- times a day:   Blood Thinner/ Instructions /Last Dose: ASA / Instructions/ Last Dose :   IRders reqyested ib 10/16/21.   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   Hx of " chest twinges" per pt has been reported to PCP in past most recent episodes have been in last week.  The twinges do not last they are come and go   Hx of hypoglycemia .  Covid test on 10/20/21  PT to call and ask Dr Cletis Media regarding vaginal hormone that is due on Wednesday.

## 2021-10-17 NOTE — Progress Notes (Signed)
DUE TO COVID-19 ONLY ONE VISITOR IS ALLOWED TO COME WITH YOU AND STAY IN THE WAITING ROOM ONLY DURING PRE OP AND PROCEDURE DAY OF SURGERY. THE 1 VISITOR  MAY VISIT WITH YOU AFTER SURGERY IN YOUR PRIVATE ROOM DURING VISITING HOURS ONLY!  YOU NEED TO HAVE A COVID 19 TEST ON_______ @_______ , THIS TEST MUST BE D10/31/22 ONE BEFORE SURGERY,  COVID TESTING SITE IS AT [place]. PLEASE REMAIN IN YOUR CAR THIS IS A DRIVER UP TEST. AFTER YOUR COVID TEST PLEASE WEAR A MASK OUT IN PUBLIC AND SOCIAL DISTANCE AND [place] YOUR HANDS FREQUENTLY. PLEASE ASK ALL YOUR CLOSE CONTACTS TO WEAR A MASK OUT IN PUBLIC AND SOCIAL DISTANCE AND [place] HANDS FREQUENTLY ALSO.               Margaret Franco  10/17/2021   Your procedure is scheduled on:  10/23/2021   Report to [facility] Main  Entrance   Report to admitting at     100pm     Call this number if you have problems the morning of surgery [phone]    Remember: Do not eat food , candy gum or mints :After Midnight. You may have clear liquids from midnight until __  1215pm   CLEAR LIQUID DIET   Foods Allowed                                                                       Coffee and tea, regular and decaf                              Plain Jell-O any favor except red or purple                                            Fruit ices (not with fruit pulp)                                      Iced Popsicles                                     Carbonated beverages, regular and diet                                    Cranberry, grape and apple juices Sports drinks like Gatorade Lightly seasoned clear broth or consume(fat free) Sugar   _____________________________________________________________________    BRUSH YOUR TEETH MORNING OF SURGERY AND RINSE YOUR MOUTH OUT, NO CHEWING GUM CANDY OR MINTS.     Take these medicines the morning of surgery with A SIP OF WATER:  none   DO NOT TAKE ANY DIABETIC MEDICATIONS DAY OF  YOUR SURGERY  You may not have any metal on your body including hair pins and              piercings  Do not wear jewelry, make-up, lotions, powders or perfumes, deodorant             Do not wear nail polish on your fingernails.  Do not shave  48 hours prior to surgery.              Men may shave face and neck.   Do not bring valuables to the hospital. [place].  Contacts, dentures or bridgework may not be worn into surgery.  Leave suitcase in the car. After surgery it may be brought to your room.     Patients discharged the day of surgery will not be allowed to drive home. IF YOU ARE HAVING SURGERY AND GOING HOME THE SAME DAY, YOU MUST HAVE AN ADULT TO DRIVE YOU HOME AND BE WITH YOU FOR 24 HOURS. YOU MAY GO HOME BY TAXI OR UBER OR ORTHERWISE, BUT AN ADULT MUST ACCOMPANY YOU HOME AND STAY WITH YOU FOR 24 HOURS.  Name and phone number of your driver:  Special Instructions: N/A              Please read over the following fact sheets you were given: _____________________________________________________________________  [facility] - Preparing for Surgery Before surgery, you can play an important role.  Because skin is not sterile, your skin needs to be as free of germs as possible.  You can reduce the number of germs on your skin by washing with CHG (chlorahexidine gluconate) soap before surgery.  CHG is an antiseptic cleaner which kills germs and bonds with the skin to continue killing germs even after washing. Please DO NOT use if you have an allergy to CHG or antibacterial soaps.  If your skin becomes reddened/irritated stop using the CHG and inform your nurse when you arrive at Short Stay. Do not shave (including legs and underarms) for at least 48 hours prior to the first CHG shower.  You may shave your face/neck. Please follow these instructions carefully:  1.  Shower with CHG Soap the night before surgery and  the  morning of Surgery.  2.  If you choose to wash your hair, wash your hair first as usual with your  normal  shampoo.  3.  After you shampoo, rinse your hair and body thoroughly to remove the  shampoo.                           4.  Use CHG as you would any other liquid soap.  You can apply chg directly  to the skin and wash                       Gently with a scrungie or clean washcloth.  5.  Apply the CHG Soap to your body ONLY FROM THE NECK DOWN.   Do not use on face/ open                           Wound or open sores. Avoid contact with eyes, ears mouth and genitals (private parts).  Wash face,  Genitals (private parts) with your normal soap.             6.  Wash thoroughly, paying special attention to the area where your surgery  will be performed.  7.  Thoroughly rinse your body with warm water from the neck down.  8.  DO NOT shower/wash with your normal soap after using and rinsing off  the CHG Soap.                9.  Pat yourself dry with a clean towel.            10.  Wear clean pajamas.            11.  Place clean sheets on your bed the night of your first shower and do not  sleep with pets. Day of Surgery : Do not apply any lotions/deodorants the morning of surgery.  Please wear clean clothes to the hospital/surgery center.  FAILURE TO FOLLOW THESE INSTRUCTIONS MAY RESULT IN THE CANCELLATION OF YOUR SURGERY PATIENT SIGNATURE_________________________________  NURSE SIGNATURE__________________________________  ________________________________________________________________________

## 2021-10-20 ENCOUNTER — Other Ambulatory Visit: Payer: Self-pay | Admitting: Obstetrics and Gynecology

## 2021-10-20 ENCOUNTER — Encounter (HOSPITAL_COMMUNITY)
Admission: RE | Admit: 2021-10-20 | Discharge: 2021-10-20 | Disposition: A | Discharge status: Home / Self Care | Payer: Commercial Managed Care - PPO | Source: Ambulatory Visit | Attending: Obstetrics and Gynecology | Admitting: Obstetrics and Gynecology

## 2021-10-20 ENCOUNTER — Encounter (HOSPITAL_COMMUNITY): Payer: Self-pay

## 2021-10-20 ENCOUNTER — Encounter (HOSPITAL_COMMUNITY): Payer: Self-pay | Admitting: Physician Assistant

## 2021-10-20 ENCOUNTER — Encounter (HOSPITAL_COMMUNITY): Payer: Self-pay | Admitting: Anesthesiology

## 2021-10-20 ENCOUNTER — Other Ambulatory Visit: Payer: Self-pay

## 2021-10-20 VITALS — BP 118/80 | HR 88 | Temp 98.1°F | Resp 16 | Ht 68.0 in | Wt 130.0 lb

## 2021-10-20 DIAGNOSIS — R1900 Intra-abdominal and pelvic swelling, mass and lump, unspecified site: Secondary | ICD-10-CM | POA: Insufficient documentation

## 2021-10-20 DIAGNOSIS — Z01818 Encounter for other preprocedural examination: Secondary | ICD-10-CM | POA: Diagnosis present

## 2021-10-20 DIAGNOSIS — N926 Irregular menstruation, unspecified: Secondary | ICD-10-CM | POA: Diagnosis not present

## 2021-10-20 HISTORY — DX: Ventricular premature depolarization: I49.3

## 2021-10-20 HISTORY — DX: Depression, unspecified: F32.A

## 2021-10-20 HISTORY — DX: Chest pain, unspecified: R07.9

## 2021-10-20 HISTORY — DX: Other complications of anesthesia, initial encounter: T88.59XA

## 2021-10-20 HISTORY — DX: Gastro-esophageal reflux disease without esophagitis: K21.9

## 2021-10-20 HISTORY — DX: Headache, unspecified: R51.9

## 2021-10-20 HISTORY — DX: Hypoglycemia, unspecified: E16.2

## 2021-10-20 HISTORY — DX: Pneumonia, unspecified organism: J18.9

## 2021-10-20 LAB — CBC
HCT: 44.4 % (ref 36.0–46.0)
Hemoglobin: 14.3 g/dL (ref 12.0–15.0)
MCH: 30.8 pg (ref 26.0–34.0)
MCHC: 32.2 g/dL (ref 30.0–36.0)
MCV: 95.7 fL (ref 80.0–100.0)
Platelets: 232 10*3/uL (ref 150–400)
RBC: 4.64 MIL/uL (ref 3.87–5.11)
RDW: 12.5 % (ref 11.5–15.5)
WBC: 4.6 10*3/uL (ref 4.0–10.5)
nRBC: 0 % (ref 0.0–0.2)

## 2021-10-20 LAB — BASIC METABOLIC PANEL
Anion gap: 5 (ref 5–15)
BUN: 16 mg/dL (ref 6–20)
CO2: 28 mmol/L (ref 22–32)
Calcium: 9 mg/dL (ref 8.9–10.3)
Chloride: 104 mmol/L (ref 98–111)
Creatinine, Ser: 0.56 mg/dL (ref 0.44–1.00)
GFR, Estimated: >60 mL/min (ref >60)
Glucose, Bld: 93 mg/dL (ref 70–99)
Potassium: 3.9 mmol/L (ref 3.5–5.1)
Sodium: 137 mmol/L (ref 135–145)

## 2021-10-20 NOTE — Progress Notes (Signed)
Anesthesia Chart Review   Case: [id number] Date/Time: 10/23/21 1500   Procedure: HYSTERECTOMY ABDOMINAL   Anesthesia type: Choice   Pre-op diagnosis: pelvic mass   Location: [facility] ROOM 03 / [facility]   Surgeons: Delsa Bern, MD       DISCUSSION:55 y.o. never smoker with h/o GERD, PVCs, pelvic mass scheduled for above procedure 10/23/2021 with Dr. Katharine Look Rivard.   Pt seen by PCP 10/02/2021 for right breast tenderness, advised to continue to monitor, tenderness likely due to increased dose of Yuvafem.  VS: BP 118/80   Pulse 88   Temp 36.7 C (Oral)   Resp 16   Ht 5\' 8"  (1.727 m)   Wt 59 kg   LMP 01/16/2013   SpO2 98%   BMI 19.77 kg/m   PROVIDERS: Harlan Stains, MD is PCP    LABS: Labs reviewed: Acceptable for surgery. (all labs ordered are listed, but only abnormal results are displayed)  Labs Reviewed  CBC  BASIC METABOLIC PANEL  TYPE AND SCREEN     IMAGES:   EKG: 10/20/2021 Rate 80 bpm Normal sinus rhythm with sinus arrhythmia Unchanged from previous EKG Abnormal ECG  CV:  Past Medical History:  Diagnosis Date   Adenopathy    submandibular   Anxiety    Chest pain    pt reports hx of " fleeting chest twinges' pt reports having told PCP in past most recent has been in last week   Complication of anesthesia    DR Earlean Shawl did surgery- passed out after procedure 8 years but with last surgery did fine- colonoscopy   Depression    Fibroids    GERD (gastroesophageal reflux disease)    Headache    History of colon polyps    Hypoglycemia    hx of per pt   Leukocytosis 10/21/2010   mild   Pneumonia    PVC (premature ventricular contraction)    hx of    Past Surgical History:  Procedure Laterality Date   BACK SURGERY     COLONOSCOPY     wisdom teeth extractoin       MEDICATIONS:  Cholecalciferol (VITAMIN D3 PO)   EPINEPHrine 0.3 mg/0.3 mL IJ SOAJ injection   YUVAFEM 10 MCG TABS vaginal tablet   Zinc Acetate-Sweetleaf (FP ZINC LOZENGES PO)   No  current facility-administered medications for this encounter.    Konrad Felix Ward, PA-C [facility] Pre-Surgical Testing [phone]

## 2021-10-21 LAB — SARS CORONAVIRUS 2 (TAT 6-24 HRS): SARS Coronavirus 2: NEGATIVE

## 2021-10-23 ENCOUNTER — Inpatient Hospital Stay (HOSPITAL_COMMUNITY)
Admission: RE | Admit: 2021-10-23 | Payer: Commercial Managed Care - PPO | Source: Home / Self Care | Admitting: Obstetrics and Gynecology

## 2021-10-23 ENCOUNTER — Encounter (HOSPITAL_COMMUNITY): Admission: RE | Payer: Self-pay | Source: Home / Self Care

## 2021-10-23 DIAGNOSIS — N926 Irregular menstruation, unspecified: Secondary | ICD-10-CM

## 2021-10-23 LAB — TYPE AND SCREEN
ABO/RH(D): O NEG
Antibody Screen: NEGATIVE

## 2021-10-23 SURGERY — HYSTERECTOMY, ABDOMINAL
Anesthesia: Choice

## 2021-11-17 ENCOUNTER — Other Ambulatory Visit: Payer: Self-pay | Admitting: Obstetrics and Gynecology

## 2021-11-18 NOTE — H&P (Addendum)
Margaret Franco is a 55 y.o. female, G:0 who  presents for hysterectomy because of an enlarging uterine fibroid, abdominal pressure and bloating. Earlier this year the patient reports persistent bloating for which she was evaluated by ultrasound and was found to have uterine fibroids. The pelvic ultrasound at that time (June 2022) showed: an anteflexed uterus (volume of 334 cc): 7.33 x 9.22 x 9.46 cm, endometrium: 4.28 mm with  #2 fibroids: right subserosal-6.2 cm and a fundal intramural-.1.2 cm.; right ovary obscured by bowel gas and left ovary-2.15 cm. She denies any vaginal bleeding, pelvic pain, changes in urination or bowel function and no dyspareunia. She had a normal colonoscopy in August 2022 yet her sensation of bloating continued.  As the year progressed she developed more pressure in her pelvis that was persistent and not affected by any activity. A follow up ultrasound in October 2022 showed a retroverted uterus: (volume of 61 cc)  5.05 x 4.16 x 5.53 cm, endometrium: 3.23 mm; an anterior mid-subserosal fibroid measuring 12.95 cm,  right ovary-2.27 cm and left ovary-3.09 cm. Due to the increased size of her fibroid over the 4 month span a pelvic MRI was performed demonstrating a uterus measuring 14.2 cm x 11.8 x 11.3 cm-(volume of 947 cc) with a right subserosal fibroid-12.2 cm, endometrium: 4 mm and normal appearing ovaries. The patient now states that she has some lower back pain (right greater than left) and occasional, unprovoked, sharp fleeting pains in her pelvis. A CEA and CA-125 were within normal limits as were her CBC and CMET results. The progression of fibroid growth was reviewed with the patient along with the persistence of her symptoms. After careful consideration she has decided to proceed with definitive therapy in the form of hysterectomy.  Past Medical History  OB History: G:0  GYN History: menarche: 55 YO;    LMP: menopausal;  Denies history of abnormal PAP smear .  Last PAP  smear: 2021, normal with negative HPV  Medical History: Depression, Anxiety, Eczema, Hypoglycemia, PVCs, Seasonal Allergies, Vitamin D and B-12 Deficiency , Recurrent Urinary Tract Infections and Colon Polyp  Surgical History: 1997 Discectomy L-5 Denies problems with anesthesia or history of blood transfusions  Family History: Leukemia, Migraine, Stomach Cancer, Breast Cancer, Asthma, Prostate Cancer, Colon Cancer, Diabetes Mellitus, Hypertension, Osteoporosis, Melanoma, Stroke and Hypercholesterolemia.  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   Medications: Yuvafem 10 mcg 1 per vagina twice a week Tizanidine 2  mg daily prn Epi-Pen  prn  Allergies  Allergen Reactions   Apple Hives   Barley Grass Hives   Clarithromycin Hives   Guaifenesin Hives   Iodinated Diagnostic Agents Hives   Iodine Hives   Latex     irritation   Peanut Oil Swelling    Swelling of the face   Wheat Bran Hives   Other Hives and Rash    Cod Fish  Denies sensitivity to soy, or adhesives.   ROS: Admits to glasses, skin rashes (eczema on hands), tinnitus (right > left), nasal congestion, back pain and bilateral hand edema in the mornings; she  denies headache, vision changes, dysphagia, dizziness, hoarseness, cough,  chest pain, shortness of breath, nausea, vomiting, diarrhea,constipation,  urinary frequency, urgency  dysuria, hematuria, vaginitis symptoms, swelling of joints,easy bruising,  myalgias, arthralgias, unexplained weight loss and except as is mentioned in the history of present illness, patient's review of systems is otherwise negative.    Physical Exam  Bp: 122/76; Height:  5'7";  Weight: 140 lbs.; BMI: 21.9  Neck: supple without masses or thyromegaly Lungs: clear to auscultation Heart: regular rate and rhythm Abdomen: firm mass from pelvis to 2 fingers below umbilicus, non-tender and no organomegaly Pelvic:EGBUS- wnl; vagina-normal rugae;  uterus-14-16 weeks size, cervix without lesions or motion tenderness; adnexae-no tenderness or masses Extremities:  no clubbing, cyanosis or edema   Assesment:  Enlarging Uterine Fibroid   Disposition:  A discussion was held with patient regarding the indication for her procedure(s) along with the risks, which include but are not limited to: reaction to anesthesia, damage to adjacent organs, infection and excessive bleeding. The patient verbalized understanding of these risks and has consented to proceed with an Abdominal Supracervical Hysterectomy, Bilateral Salpingectomy at [facility] on November 27, 2021.   CSN# [id number]   Margaret J. Florene Glen, PA-C  for Dr. Dede Franco. Margaret Franco

## 2021-11-19 NOTE — Progress Notes (Signed)
Pt. Needs orders for upcomming procedure.

## 2021-11-19 NOTE — Patient Instructions (Signed)
DUE TO COVID-19 ONLY ONE VISITOR IS ALLOWED TO COME WITH YOU AND STAY IN THE WAITING ROOM ONLY DURING PRE OP AND PROCEDURE.   **NO VISITORS ARE ALLOWED IN THE SHORT STAY AREA OR RECOVERY ROOM!!**  IF YOU WILL BE ADMITTED INTO THE HOSPITAL YOU ARE ALLOWED ONLY TWO SUPPORT PEOPLE DURING VISITATION HOURS ONLY (7 AM -8PM)   The support person(s) must pass our screening, gel in and out, and wear a mask at all times, including in the patient's room. Patients must also wear a mask when staff or their support person are in the room. Visitors GUEST BADGE MUST BE WORN VISIBLY  One adult visitor may remain with you overnight and MUST be in the room by 8 P.M.  No visitors under the age of 34. Any visitor under the age of 73 must be accompanied by an adult.    COVID SWAB TESTING MUST BE COMPLETED ON: 11/25/21  **MUST PRESENT COMPLETED FORM AT TESTING SITE**    [place] [address] [place] (backside of the building) You are not required to quarantine, however you are required to wear a well-fitted mask when you are out and around people not in your household.  Hand Hygiene often Do NOT share personal items Notify your provider if you are in close contact with someone who has COVID or you develop fever 100.4 or greater, new onset of sneezing, cough, sore throat, shortness of breath or body aches.  [address], [address], must go inside of the hospital, NOT A DRIVE THRU!  (Must self quarantine after testing. Follow instructions on handout.)       Your procedure is scheduled on: 11/27/21   Report to [facility] Main Entrance    Report to short stay at: 5:15 AM   Call this number if you have problems the morning of surgery [phone]   Do not eat food :After Midnight.   May have liquids until: 4:30 AM    day of surgery  CLEAR LIQUID DIET  Foods Allowed                                                                      Foods Excluded  Water, Black Coffee and tea, regular and decaf                             liquids that you cannot  Plain Jell-O in any flavor  (No red)                                           see through such as: Fruit ices (not with fruit pulp)                                     milk, soups, orange juice              Iced Popsicles (No red)  All solid food                                   Apple juices Sports drinks like Gatorade (No red) Lightly seasoned clear broth or consume(fat free) Sugar  Sample Menu Breakfast                                Lunch                                     Supper Cranberry juice                    Beef broth                            Chicken broth Jell-O                                     Grape juice                           Apple juice Coffee or tea                        Jell-O                                      Popsicle                                                Coffee or tea                        Coffee or tea     Oral Hygiene is also important to reduce your risk of infection.                                    Remember - BRUSH YOUR TEETH THE MORNING OF SURGERY WITH YOUR REGULAR TOOTHPASTE   Do NOT smoke after Midnight   Take these medicines the morning of surgery with A SIP OF WATER: N/A  DO NOT TAKE ANY ORAL DIABETIC MEDICATIONS DAY OF YOUR SURGERY                              You may not have any metal on your body including hair pins, jewelry, and body piercing             Do not wear make-up, lotions, powders, perfumes/cologne, or deodorant  Do not wear nail polish including gel and S&S, artificial/acrylic nails, or any other type of covering on natural nails including finger and toenails. If you have artificial nails, gel coating, etc. that needs to be removed by a nail salon please have this removed prior to surgery or surgery  may need to be canceled/ delayed if the surgeon/ anesthesia feels  like they are unable to be safely monitored.   Do not shave  48 hours prior to surgery.    Do not bring valuables to the hospital. [place].   Contacts, dentures or bridgework may not be worn into surgery.   Bring small overnight bag day of surgery.    Patients discharged on the day of surgery will not be allowed to drive home.   Special Instructions: Bring a copy of your healthcare power of attorney and living will documents         the day of surgery if you haven't scanned them before.              Please read over the following fact sheets you were given: IF YOU HAVE QUESTIONS ABOUT YOUR PRE-OP INSTRUCTIONS PLEASE CALL [phone]     [facility] - Preparing for Surgery Before surgery, you can play an important role.  Because skin is not sterile, your skin needs to be as free of germs as possible.  You can reduce the number of germs on your skin by washing with CHG (chlorahexidine gluconate) soap before surgery.  CHG is an antiseptic cleaner which kills germs and bonds with the skin to continue killing germs even after washing. Please DO NOT use if you have an allergy to CHG or antibacterial soaps.  If your skin becomes reddened/irritated stop using the CHG and inform your nurse when you arrive at Short Stay. Do not shave (including legs and underarms) for at least 48 hours prior to the first CHG shower.  You may shave your face/neck. Please follow these instructions carefully:  1.  Shower with CHG Soap the night before surgery and the  morning of Surgery.  2.  If you choose to wash your hair, wash your hair first as usual with your  normal  shampoo.  3.  After you shampoo, rinse your hair and body thoroughly to remove the  shampoo.                           4.  Use CHG as you would any other liquid soap.  You can apply chg directly  to the skin and wash                       Gently with a scrungie or clean washcloth.  5.  Apply the CHG  Soap to your body ONLY FROM THE NECK DOWN.   Do not use on face/ open                           Wound or open sores. Avoid contact with eyes, ears mouth and genitals (private parts).                       Wash face,  Genitals (private parts) with your normal soap.             6.  Wash thoroughly, paying special attention to the area where your surgery  will be performed.  7.  Thoroughly rinse your body with warm water from the neck down.  8.  DO NOT shower/wash with your normal soap after using and rinsing off  the CHG Soap.  9.  Pat yourself dry with a clean towel.            10.  Wear clean pajamas.            11.  Place clean sheets on your bed the night of your first shower and do not  sleep with pets. Day of Surgery : Do not apply any lotions/deodorants the morning of surgery.  Please wear clean clothes to the hospital/surgery center.  FAILURE TO FOLLOW THESE INSTRUCTIONS MAY RESULT IN THE CANCELLATION OF YOUR SURGERY PATIENT SIGNATURE_________________________________  NURSE SIGNATURE__________________________________  ________________________________________________________________________

## 2021-11-20 ENCOUNTER — Other Ambulatory Visit: Payer: Self-pay

## 2021-11-20 ENCOUNTER — Encounter (HOSPITAL_COMMUNITY)
Admission: RE | Admit: 2021-11-20 | Discharge: 2021-11-20 | Disposition: A | Discharge status: Home / Self Care | Payer: Commercial Managed Care - PPO | Source: Ambulatory Visit | Attending: Obstetrics and Gynecology | Admitting: Obstetrics and Gynecology

## 2021-11-20 ENCOUNTER — Encounter (HOSPITAL_COMMUNITY): Payer: Self-pay

## 2021-11-20 VITALS — BP 134/70 | HR 84 | Temp 97.7°F | Ht 67.5 in | Wt 132.0 lb

## 2021-11-20 DIAGNOSIS — Z01812 Encounter for preprocedural laboratory examination: Secondary | ICD-10-CM | POA: Insufficient documentation

## 2021-11-20 DIAGNOSIS — I2510 Atherosclerotic heart disease of native coronary artery without angina pectoris: Secondary | ICD-10-CM | POA: Insufficient documentation

## 2021-11-20 DIAGNOSIS — Z01818 Encounter for other preprocedural examination: Secondary | ICD-10-CM

## 2021-11-20 LAB — BASIC METABOLIC PANEL
Anion gap: 4 — ABNORMAL LOW (ref 5–15)
BUN: 15 mg/dL (ref 6–20)
CO2: 28 mmol/L (ref 22–32)
Calcium: 9 mg/dL (ref 8.9–10.3)
Chloride: 105 mmol/L (ref 98–111)
Creatinine, Ser: 0.72 mg/dL (ref 0.44–1.00)
GFR, Estimated: >60 mL/min (ref >60)
Glucose, Bld: 84 mg/dL (ref 70–99)
Potassium: 4.5 mmol/L (ref 3.5–5.1)
Sodium: 137 mmol/L (ref 135–145)

## 2021-11-20 LAB — CBC
HCT: 47.2 % — ABNORMAL HIGH (ref 36.0–46.0)
Hemoglobin: 15 g/dL (ref 12.0–15.0)
MCH: 30.2 pg (ref 26.0–34.0)
MCHC: 31.8 g/dL (ref 30.0–36.0)
MCV: 95.2 fL (ref 80.0–100.0)
Platelets: 248 10*3/uL (ref 150–400)
RBC: 4.96 MIL/uL (ref 3.87–5.11)
RDW: 12.2 % (ref 11.5–15.5)
WBC: 4.2 10*3/uL (ref 4.0–10.5)
nRBC: 0 % (ref 0.0–0.2)

## 2021-11-20 NOTE — Progress Notes (Signed)
COVID Vaccine Completed: Yes Date COVID Vaccine completed: 10/29/21 x 5 COVID vaccine manufacturer: [place] Test: 11/25/21 PCP - Dr. Harlan Stains Cardiologist -   Chest x-ray - 06/25/21 EKG - 10/20/21 Stress Test -  ECHO -  Cardiac Cath -  Pacemaker/ICD device last checked:  Sleep Study -  CPAP -   Fasting Blood Sugar -  Checks Blood Sugar _____ times a day  Blood Thinner Instructions: Aspirin Instructions: Last Dose:  Anesthesia review: Hx: PVC's  Patient denies shortness of breath, fever, cough and chest pain at PAT appointment   Patient verbalized understanding of instructions that were given to them at the PAT appointment. Patient was also instructed that they will need to review over the PAT instructions again at home before surgery.

## 2021-11-25 ENCOUNTER — Encounter (HOSPITAL_COMMUNITY): Payer: Commercial Managed Care - PPO

## 2021-11-25 ENCOUNTER — Other Ambulatory Visit: Payer: Self-pay | Admitting: Obstetrics and Gynecology

## 2021-11-25 LAB — SARS CORONAVIRUS 2 (TAT 6-24 HRS): SARS Coronavirus 2: NEGATIVE

## 2021-11-26 NOTE — Anesthesia Preprocedure Evaluation (Addendum)
Anesthesia Evaluation  Patient identified by MRN, date of birth, ID band Patient awake    Reviewed: Allergy & Precautions, NPO status , Patient's Chart, lab work & pertinent test results  Airway Mallampati: I  TM Distance: >3 FB Neck ROM: Full    Dental no notable dental hx.    Pulmonary    Pulmonary exam normal breath sounds clear to auscultation       Cardiovascular Exercise Tolerance: Good negative cardio ROS Normal cardiovascular exam Rhythm:Regular Rate:Normal  Normal sinus rhythm with sinus arrhythmia Septal infarct , age undetermined Abnormal ECG Confirmed by Loralie Champagne [phone]) on 10/20/2021 8:54:58 PM   Neuro/Psych  Headaches, PSYCHIATRIC DISORDERS Anxiety Depression    GI/Hepatic Neg liver ROS, GERD  ,  Endo/Other  negative endocrine ROS  Renal/GU negative Renal ROS  negative genitourinary   Musculoskeletal negative musculoskeletal ROS (+)   Abdominal   Peds  Hematology negative hematology ROS (+)   Anesthesia Other Findings   Reproductive/Obstetrics Pelvic mass                            Anesthesia Physical Anesthesia Plan  ASA: 2  Anesthesia Plan: General   Post-op Pain Management: Tylenol PO (pre-op), Gabapentin PO (pre-op) and Toradol IV (intra-op)   Induction: Intravenous  PONV Risk Score and Plan: 3 and Treatment may vary due to age or medical condition, Scopolamine patch - Pre-op, Midazolam, Ondansetron and Dexamethasone  Airway Management Planned: Oral ETT  Additional Equipment: None  Intra-op Plan:   Post-operative Plan: Extubation in OR  Informed Consent: I have reviewed the patients History and Physical, chart, labs and discussed the procedure including the risks, benefits and alternatives for the proposed anesthesia with the patient or authorized representative who has indicated his/her understanding and acceptance.     Dental advisory  given  Plan Discussed with: Anesthesiologist and CRNA  Anesthesia Plan Comments: (Patient had several questions, all questions answered to the patient's satisfaction. Norton Blizzard, MD  )       Anesthesia Quick Evaluation

## 2021-11-27 ENCOUNTER — Observation Stay (HOSPITAL_COMMUNITY)
Admission: RE | Admit: 2021-11-27 | Discharge: 2021-11-28 | Disposition: A | Discharge status: Home / Self Care | Payer: Commercial Managed Care - PPO | Attending: Obstetrics and Gynecology | Admitting: Obstetrics and Gynecology

## 2021-11-27 ENCOUNTER — Encounter (HOSPITAL_COMMUNITY)
Admission: RE | Disposition: A | Discharge status: Home / Self Care | Payer: Self-pay | Source: Home / Self Care | Attending: Obstetrics and Gynecology

## 2021-11-27 ENCOUNTER — Ambulatory Visit (HOSPITAL_COMMUNITY): Payer: Commercial Managed Care - PPO | Admitting: Physician Assistant

## 2021-11-27 ENCOUNTER — Ambulatory Visit (HOSPITAL_COMMUNITY): Payer: Commercial Managed Care - PPO | Admitting: Anesthesiology

## 2021-11-27 ENCOUNTER — Encounter (HOSPITAL_COMMUNITY): Payer: Self-pay | Admitting: Obstetrics and Gynecology

## 2021-11-27 DIAGNOSIS — D259 Leiomyoma of uterus, unspecified: Principal | ICD-10-CM | POA: Insufficient documentation

## 2021-11-27 DIAGNOSIS — Z800 Family history of malignant neoplasm of digestive organs: Secondary | ICD-10-CM | POA: Insufficient documentation

## 2021-11-27 DIAGNOSIS — Z91040 Latex allergy status: Secondary | ICD-10-CM | POA: Diagnosis not present

## 2021-11-27 DIAGNOSIS — Z853 Personal history of malignant neoplasm of breast: Secondary | ICD-10-CM | POA: Insufficient documentation

## 2021-11-27 DIAGNOSIS — Z302 Encounter for sterilization: Secondary | ICD-10-CM

## 2021-11-27 DIAGNOSIS — D219 Benign neoplasm of connective and other soft tissue, unspecified: Secondary | ICD-10-CM | POA: Diagnosis present

## 2021-11-27 HISTORY — PX: ABDOMINAL HYSTERECTOMY: SHX81

## 2021-11-27 LAB — TYPE AND SCREEN
ABO/RH(D): O NEG
Antibody Screen: NEGATIVE

## 2021-11-27 SURGERY — HYSTERECTOMY, ABDOMINAL
Anesthesia: General | Site: Uterus

## 2021-11-27 MED ORDER — GENTAMICIN SULFATE 40 MG/ML IJ SOLN
5.0000 mg/kg | INTRAVENOUS | Status: AC
  Administered 2021-11-27: 300 mg via INTRAVENOUS
  Filled 2021-11-27: qty 7.5

## 2021-11-27 MED ORDER — ONDANSETRON HCL 4 MG/2ML IJ SOLN
INTRAMUSCULAR | Status: DC | PRN
  Administered 2021-11-27: 4 mg via INTRAVENOUS

## 2021-11-27 MED ORDER — FENTANYL CITRATE PF 50 MCG/ML IJ SOSY
PREFILLED_SYRINGE | INTRAMUSCULAR | Status: AC
  Filled 2021-11-27: qty 2

## 2021-11-27 MED ORDER — PHENYLEPHRINE 40 MCG/ML (10ML) SYRINGE FOR IV PUSH (FOR BLOOD PRESSURE SUPPORT)
PREFILLED_SYRINGE | INTRAVENOUS | Status: DC | PRN
  Administered 2021-11-27 (×3): 80 ug via INTRAVENOUS

## 2021-11-27 MED ORDER — FENTANYL CITRATE (PF) 100 MCG/2ML IJ SOLN
INTRAMUSCULAR | Status: DC | PRN
  Administered 2021-11-27: 50 ug via INTRAVENOUS
  Administered 2021-11-27: 100 ug via INTRAVENOUS

## 2021-11-27 MED ORDER — ONDANSETRON HCL 4 MG/2ML IJ SOLN: 4.0000 mg | Freq: Four times a day (QID) | INTRAMUSCULAR | Status: DC | PRN

## 2021-11-27 MED ORDER — FENTANYL CITRATE (PF) 250 MCG/5ML IJ SOLN
INTRAMUSCULAR | Status: AC
  Filled 2021-11-27: qty 5

## 2021-11-27 MED ORDER — BUPIVACAINE HCL (PF) 0.25 % IJ SOLN
INTRAMUSCULAR | Status: AC
  Filled 2021-11-27: qty 30

## 2021-11-27 MED ORDER — LACTATED RINGERS IV SOLN: INTRAVENOUS | Status: DC

## 2021-11-27 MED ORDER — VASOPRESSIN 20 UNIT/ML IV SOLN
INTRAVENOUS | Status: DC | PRN
  Administered 2021-11-27: 8 mL via INTRAMUSCULAR

## 2021-11-27 MED ORDER — ORAL CARE MOUTH RINSE: 15.0000 mL | Freq: Once | OROMUCOSAL | Status: AC

## 2021-11-27 MED ORDER — AMISULPRIDE (ANTIEMETIC) 5 MG/2ML IV SOLN: 10.0000 mg | Freq: Once | INTRAVENOUS | Status: DC | PRN

## 2021-11-27 MED ORDER — LIDOCAINE 2% (20 MG/ML) 5 ML SYRINGE
INTRAMUSCULAR | Status: DC | PRN
  Administered 2021-11-27: 80 mg via INTRAVENOUS

## 2021-11-27 MED ORDER — OXYCODONE HCL 5 MG/5ML PO SOLN: 5.0000 mg | Freq: Once | ORAL | Status: DC | PRN

## 2021-11-27 MED ORDER — DEXAMETHASONE SODIUM PHOSPHATE 10 MG/ML IJ SOLN
INTRAMUSCULAR | Status: DC | PRN
  Administered 2021-11-27: 5 mg via INTRAVENOUS

## 2021-11-27 MED ORDER — KETOROLAC TROMETHAMINE 30 MG/ML IJ SOLN
30.0000 mg | Freq: Four times a day (QID) | INTRAMUSCULAR | Status: AC
  Administered 2021-11-27 – 2021-11-28 (×2): 30 mg via INTRAVENOUS
  Filled 2021-11-27 (×2): qty 1

## 2021-11-27 MED ORDER — MIDAZOLAM HCL 5 MG/5ML IJ SOLN
INTRAMUSCULAR | Status: DC | PRN
  Administered 2021-11-27: 2 mg via INTRAVENOUS

## 2021-11-27 MED ORDER — KETOROLAC TROMETHAMINE 30 MG/ML IJ SOLN
INTRAMUSCULAR | Status: AC
  Filled 2021-11-27: qty 1

## 2021-11-27 MED ORDER — LIDOCAINE 2% (20 MG/ML) 5 ML SYRINGE
INTRAMUSCULAR | Status: DC | PRN
  Administered 2021-11-27: 1.5 mg/kg/h via INTRAVENOUS

## 2021-11-27 MED ORDER — OXYCODONE HCL 5 MG PO TABS
ORAL_TABLET | ORAL | Status: AC
  Filled 2021-11-27: qty 1

## 2021-11-27 MED ORDER — ROCURONIUM BROMIDE 10 MG/ML (PF) SYRINGE
PREFILLED_SYRINGE | INTRAVENOUS | Status: DC | PRN
  Administered 2021-11-27: 80 mg via INTRAVENOUS

## 2021-11-27 MED ORDER — GABAPENTIN 300 MG PO CAPS
300.0000 mg | ORAL_CAPSULE | ORAL | Status: AC
  Administered 2021-11-27: 300 mg via ORAL
  Filled 2021-11-27: qty 1

## 2021-11-27 MED ORDER — DOCUSATE SODIUM 100 MG PO CAPS
100.0000 mg | ORAL_CAPSULE | Freq: Two times a day (BID) | ORAL | Status: DC
  Administered 2021-11-27 – 2021-11-28 (×3): 100 mg via ORAL
  Filled 2021-11-27 (×4): qty 1

## 2021-11-27 MED ORDER — GABAPENTIN 300 MG PO CAPS: 300.0000 mg | ORAL_CAPSULE | Freq: Once | ORAL | Status: DC

## 2021-11-27 MED ORDER — KETAMINE HCL 50 MG/ML IJ SOLN
INTRAMUSCULAR | Status: DC | PRN
  Administered 2021-11-27: 30 mg via INTRAMUSCULAR

## 2021-11-27 MED ORDER — ONDANSETRON HCL 4 MG PO TABS
4.0000 mg | ORAL_TABLET | Freq: Four times a day (QID) | ORAL | Status: DC | PRN
  Filled 2021-11-27: qty 1

## 2021-11-27 MED ORDER — BUPIVACAINE HCL (PF) 0.25 % IJ SOLN
INTRAMUSCULAR | Status: DC | PRN
  Administered 2021-11-27: 15 mL

## 2021-11-27 MED ORDER — KETAMINE HCL 10 MG/ML IJ SOLN
INTRAMUSCULAR | Status: AC
  Filled 2021-11-27: qty 1

## 2021-11-27 MED ORDER — ONDANSETRON HCL 4 MG/2ML IJ SOLN
INTRAMUSCULAR | Status: AC
  Filled 2021-11-27: qty 2

## 2021-11-27 MED ORDER — EPHEDRINE SULFATE-NACL 50-0.9 MG/10ML-% IV SOSY
PREFILLED_SYRINGE | INTRAVENOUS | Status: DC | PRN
  Administered 2021-11-27: 10 mg via INTRAVENOUS
  Administered 2021-11-27: 15 mg via INTRAVENOUS

## 2021-11-27 MED ORDER — FENTANYL CITRATE PF 50 MCG/ML IJ SOSY
PREFILLED_SYRINGE | INTRAMUSCULAR | Status: AC
  Filled 2021-11-27: qty 1

## 2021-11-27 MED ORDER — SIMETHICONE 80 MG PO CHEW
80.0000 mg | CHEWABLE_TABLET | Freq: Four times a day (QID) | ORAL | Status: DC | PRN
  Filled 2021-11-27: qty 1

## 2021-11-27 MED ORDER — MENTHOL 3 MG MT LOZG: 1.0000 | LOZENGE | OROMUCOSAL | Status: DC | PRN

## 2021-11-27 MED ORDER — VASOPRESSIN 20 UNIT/ML IV SOLN
INTRAVENOUS | Status: AC
  Filled 2021-11-27: qty 1

## 2021-11-27 MED ORDER — PROMETHAZINE HCL 25 MG/ML IJ SOLN: 6.2500 mg | INTRAMUSCULAR | Status: DC | PRN

## 2021-11-27 MED ORDER — FENTANYL CITRATE PF 50 MCG/ML IJ SOSY
25.0000 ug | PREFILLED_SYRINGE | INTRAMUSCULAR | Status: DC | PRN
  Administered 2021-11-27 (×3): 50 ug via INTRAVENOUS

## 2021-11-27 MED ORDER — MIDAZOLAM HCL 2 MG/2ML IJ SOLN
INTRAMUSCULAR | Status: AC
  Filled 2021-11-27: qty 2

## 2021-11-27 MED ORDER — ACETAMINOPHEN 500 MG PO TABS: 1000.0000 mg | ORAL_TABLET | ORAL | Status: DC

## 2021-11-27 MED ORDER — CLINDAMYCIN PHOSPHATE 900 MG/50ML IV SOLN
900.0000 mg | INTRAVENOUS | Status: AC
  Administered 2021-11-27: 900 mg via INTRAVENOUS
  Filled 2021-11-27: qty 50

## 2021-11-27 MED ORDER — FENTANYL CITRATE PF 50 MCG/ML IJ SOSY
25.0000 ug | PREFILLED_SYRINGE | INTRAMUSCULAR | Status: DC | PRN
  Administered 2021-11-27: 25 ug via INTRAVENOUS

## 2021-11-27 MED ORDER — HYDROMORPHONE HCL 1 MG/ML IJ SOLN: 1.0000 mg | Freq: Four times a day (QID) | INTRAMUSCULAR | Status: DC | PRN

## 2021-11-27 MED ORDER — SCOPOLAMINE 1 MG/3DAYS TD PT72
1.0000 | MEDICATED_PATCH | TRANSDERMAL | Status: DC
  Administered 2021-11-27: 1.5 mg via TRANSDERMAL
  Filled 2021-11-27: qty 1

## 2021-11-27 MED ORDER — GABAPENTIN 300 MG PO CAPS: 300.0000 mg | ORAL_CAPSULE | ORAL | Status: DC

## 2021-11-27 MED ORDER — OXYCODONE HCL 5 MG PO TABS: 5.0000 mg | ORAL_TABLET | Freq: Once | ORAL | Status: DC | PRN

## 2021-11-27 MED ORDER — ACETAMINOPHEN 500 MG PO TABS
1000.0000 mg | ORAL_TABLET | Freq: Four times a day (QID) | ORAL | Status: DC
  Administered 2021-11-27 – 2021-11-28 (×4): 1000 mg via ORAL
  Filled 2021-11-27 (×4): qty 2

## 2021-11-27 MED ORDER — OXYCODONE HCL 5 MG PO TABS
5.0000 mg | ORAL_TABLET | ORAL | Status: DC | PRN
  Administered 2021-11-27: 5 mg via ORAL
  Filled 2021-11-27: qty 1

## 2021-11-27 MED ORDER — PROPOFOL 10 MG/ML IV BOLUS
INTRAVENOUS | Status: DC | PRN
  Administered 2021-11-27: 150 mg via INTRAVENOUS

## 2021-11-27 MED ORDER — CHLORHEXIDINE GLUCONATE 0.12 % MT SOLN
15.0000 mL | Freq: Once | OROMUCOSAL | Status: AC
  Administered 2021-11-27: 15 mL via OROMUCOSAL

## 2021-11-27 MED ORDER — SUGAMMADEX SODIUM 200 MG/2ML IV SOLN
INTRAVENOUS | Status: DC | PRN
Start: 2021-11-27 — End: 2021-11-27
  Administered 2021-11-27: 150 mg via INTRAVENOUS

## 2021-11-27 MED ORDER — PROPOFOL 10 MG/ML IV BOLUS
INTRAVENOUS | Status: AC
  Filled 2021-11-27: qty 20

## 2021-11-27 MED ORDER — ACETAMINOPHEN 500 MG PO TABS
1000.0000 mg | ORAL_TABLET | ORAL | Status: DC
  Filled 2021-11-27: qty 2

## 2021-11-27 MED ORDER — ACETAMINOPHEN 500 MG PO TABS
1000.0000 mg | ORAL_TABLET | Freq: Once | ORAL | Status: DC
  Administered 2021-11-27: 1000 mg via ORAL

## 2021-11-27 SURGICAL SUPPLY — 45 items
BAG COUNTER SPONGE SURGICOUNT (BAG) IMPLANT
BENZOIN TINCTURE PRP APPL 2/3 (GAUZE/BANDAGES/DRESSINGS) ×2 IMPLANT
CANISTER SUCT 3000ML PPV (MISCELLANEOUS) ×2 IMPLANT
CELLS DAT CNTRL 66122 CELL SVR (MISCELLANEOUS) IMPLANT
CHLORAPREP W/TINT 26 (MISCELLANEOUS) ×2 IMPLANT
CLSR STERI-STRIP ANTIMIC 1/2X4 (GAUZE/BANDAGES/DRESSINGS) ×2 IMPLANT
DECANTER SPIKE VIAL GLASS SM (MISCELLANEOUS) IMPLANT
DISSECTOR ROUND CHERRY 3/8 STR (MISCELLANEOUS) ×2 IMPLANT
DRAPE WARM FLUID 44X44 (DRAPES) ×2 IMPLANT
DRSG OPSITE POSTOP 4X10 (GAUZE/BANDAGES/DRESSINGS) ×2 IMPLANT
DRSG PAD ABDOMINAL 8X10 ST (GAUZE/BANDAGES/DRESSINGS) ×2 IMPLANT
GAUZE 4X4 16PLY ~~LOC~~+RFID DBL (SPONGE) IMPLANT
GLOVE SURG UNDER POLY LF SZ7 (GLOVE) ×8 IMPLANT
GOWN STRL REUS W/TWL LRG LVL3 (GOWN DISPOSABLE) ×6 IMPLANT
KIT TURNOVER KIT A (KITS) IMPLANT
NDL SAFETY ECLIPSE 18X1.5 (NEEDLE) ×1 IMPLANT
NEEDLE HYPO 18GX1.5 SHARP (NEEDLE) ×2
NEEDLE HYPO 22GX1.5 SAFETY (NEEDLE) ×2 IMPLANT
NS IRRIG 1000ML POUR BTL (IV SOLUTION) ×2 IMPLANT
PACK ABDOMINAL GYN (CUSTOM PROCEDURE TRAY) ×2 IMPLANT
PAD OB MATERNITY 4.3X12.25 (PERSONAL CARE ITEMS) ×2 IMPLANT
PENCIL SMOKE EVACUATOR (MISCELLANEOUS) IMPLANT
PROTECTOR NERVE ULNAR (MISCELLANEOUS) ×2 IMPLANT
RETRACTOR WND ALEXIS 25 LRG (MISCELLANEOUS) ×1 IMPLANT
RTRCTR WOUND ALEXIS 18CM MED (MISCELLANEOUS)
RTRCTR WOUND ALEXIS 25CM LRG (MISCELLANEOUS) ×2
SPONGE T-LAP 18X18 ~~LOC~~+RFID (SPONGE) ×2 IMPLANT
STAPLER VISISTAT 35W (STAPLE) IMPLANT
STRIP CLOSURE SKIN 1/2X4 (GAUZE/BANDAGES/DRESSINGS) ×2 IMPLANT
SUT MNCRL AB 3-0 PS2 27 (SUTURE) ×2 IMPLANT
SUT PLAIN 3 0 CT 1 27 (SUTURE) IMPLANT
SUT PROLENE 3 0 SH DA (SUTURE) IMPLANT
SUT VIC AB 0 CT1 18XCR BRD8 (SUTURE) ×3 IMPLANT
SUT VIC AB 0 CT1 27 (SUTURE) ×8
SUT VIC AB 0 CT1 27XBRD ANBCTR (SUTURE) ×4 IMPLANT
SUT VIC AB 0 CT1 8-18 (SUTURE) ×6
SUT VIC AB 2-0 SH 27 (SUTURE)
SUT VIC AB 2-0 SH 27XBRD (SUTURE) IMPLANT
SUT VIC AB 3-0 SH 27 (SUTURE)
SUT VIC AB 3-0 SH 27X BRD (SUTURE) IMPLANT
SUT VICRYL 0 TIES 12 18 (SUTURE) ×2 IMPLANT
SYR CONTROL 10ML LL (SYRINGE) ×2 IMPLANT
TAPE PAPER 1X10 WHT MICROPORE (GAUZE/BANDAGES/DRESSINGS) ×2 IMPLANT
TOWEL OR 17X26 10 PK STRL BLUE (TOWEL DISPOSABLE) ×6 IMPLANT
TRAY FOLEY MTR SLVR 14FR STAT (SET/KITS/TRAYS/PACK) ×2 IMPLANT

## 2021-11-27 NOTE — Transfer of Care (Signed)
Immediate Anesthesia Transfer of Care Note  Patient: Margaret Franco  Procedure(s) Performed: ABDOMINAL SUPRACERVICAL HYSTERECTOMY BILATERAL SALPINGECTOMY (Uterus)  Patient Location: PACU  Anesthesia Type:General  Level of Consciousness: awake, alert  and oriented  Airway & Oxygen Therapy: Patient Spontanous Breathing and Patient connected to face mask oxygen  Post-op Assessment: Report given to RN and Post -op Vital signs reviewed and stable  Post vital signs: Reviewed and stable  Last Vitals:  Vitals Value Taken Time  BP    Temp    Pulse 50 11/27/21 1008  Resp 18 11/27/21 1008  SpO2 100 % 11/27/21 1008  Vitals shown include unvalidated device data.  Last Pain:  Vitals:   11/27/21 0608  TempSrc: Oral  PainSc:          Complications: No notable events documented.

## 2021-11-27 NOTE — Interval H&P Note (Signed)
History and Physical Interval Note:  11/27/2021 7:31 AM  Margaret Franco  has presented today for surgery, with the diagnosis of PELVIC MASS.  The various methods of treatment have been discussed with the patient and family. After consideration of risks, benefits and other options for treatment, the patient has consented to  Procedure(s):SUPRACERVICAL HYSTERECTOMY ABDOMINAL (N/A) as a surgical intervention.  The patient's history has been reviewed, patient examined, no change in status, stable for surgery.  I have reviewed the patient's chart and labs.  Questions were answered to the patient's satisfaction.     Katharine Look A Thyra Yinger

## 2021-11-27 NOTE — Op Note (Signed)
Preoperative diagnosis: uterine fibroid  Postoperative diagnosis: Same  Anesthesia: Gen.  Anesthesiologist: Dr. Elgie Congo  Procedure: Supracervical abdominal hysterectomy with bilateral salpingectomy  Surgeon: Dr. Cletis Media  Asst.: Earnstine Regal PA-C  Estimated blood loss: 50 cc  Procedure:  After being informed of the planned procedure with possible complications including but not limited to bleeding, infection, injury to other organs, informed consent is obtained and patient is taken to or #7. She is placed in the dorsal decubitus position, prepped and draped in a sterile fashion. A Foley catheter is inserted in her bladder.  We infiltrate the supra-pubic area using 20 cc of Marcaine 0.25 and perform a Pfannenstiel incision which is brought down sharply to the fascia. The fascia is incised in a low transverse fashion. Linea alba is dissected and peritoneum is entered bluntly. A large Alexis retractor is positioned easily and bowels are retracted with abdominal packings.  Observation: A large anterior lower uterine segment fibroid is noted, measuring 13 cm. The uterus is small and behind the fibroid. Both tubes and ovaries are normal. Anterior and posterior cul-de-sac are normal.  The fibroid is grasped with a corkscrew manipulator which allows [place] to pull it out of the pelvic/abdominal cavity. Its exterior is smooth. There are no adhesions. We infiltrate the base of the fibroid with 8 cc of Vasopressin diluted 20 units:100 cc Saline.The fibroid is bluntly freed from its serosal, its bas is clamped with a Rogers forceps and sectioned. The fibroid is removed intact. Its base is tied with a transfixed suture of 0 Vicryl.The uterus is then grasped with 2 Kelly forceps.  Each round ligament is then sutured with a transfixed suture of 0 Vicryl and sectioned with cautery. Each utero-ovarian ligament is then isolated on a Rogers clamp, sectioned and sutured with a transfixed suture of 0 Vicryl and a free  tie of 0 Vicryl.  Both tubes are then removed after clamping the mesosalpinx with a Rogers clamp. Tubes are excised. Mesosalpinx is sutured with a transfixed suture of 0 Vicryl.The anterior broad ligament is entered sharply all the way across the lower uterine segment allowing [place] to sharply and bluntly dissect bladder below the cervix. Both ureters are easily located. Vascular pedicles are then skeletonized and isolated on Rogers forceps. The vascular pedicle is then sectioned and sutured with a transfix suture of 0 Vicryl. Cardinal ligaments are then isolated on Rogers clamps, sectioned and sutured with a transfixed suture of 0 Vicryl. Confirming bladder is dissected below the cervix we can now amputate the body of the uterus from the cervix.The cervical canal is cauterized with Bovie. There cervical stump is sutured it with figure-of-eight stitches  of 0 Vicryl.   We irrigated profusely with warm saline and complete hemostasis with cauterization.  Hemostasis is then confirmed adequate. Both ureters are visualized, normal in size with normal peristaltic activity.  All sponges and retractors are removed. Under fascia hemostasis is completed with cauterization. The fascia is closed with 2 running sutures of 0 Vicryl meeting midline. The wound is irrigated with warm saline and hemostasis is completed with cauterization. The skin is closed with a subcuticular suture of 3-0 Monocryl and Steri-Strips.  Instruments and sponge count is complete 2.   Estimated blood loss is 50 cc.  The procedure is well tolerated by the patient is taken to recovery room in a well and stable condition.  Dr Cletis Media was present and scrubbed at all times. Surgical assistance was required due to the complexity of the anatomy and procedure.  Specimen: Fibroid, Uterus and tubes weighing 801 g sent to pathology

## 2021-11-27 NOTE — Anesthesia Postprocedure Evaluation (Signed)
Anesthesia Post Note  Patient: Margaret Franco  Procedure(s) Performed: ABDOMINAL SUPRACERVICAL HYSTERECTOMY BILATERAL SALPINGECTOMY (Uterus)     Patient location during evaluation: PACU Anesthesia Type: General Level of consciousness: sedated Pain management: pain level controlled Vital Signs Assessment: post-procedure vital signs reviewed and stable Respiratory status: spontaneous breathing and respiratory function stable Cardiovascular status: stable Postop Assessment: no apparent nausea or vomiting Anesthetic complications: no   No notable events documented.  Last Vitals:  Vitals:   11/27/21 1230 11/27/21 1330  BP: (!) 109/52 121/62  Pulse: (!) 33 89  Resp: 18 18  Temp:  36.7 C  SpO2: 100% 100%    Last Pain:  Vitals:   11/27/21 1330  TempSrc:   PainSc: 6                  Candra R [place]

## 2021-11-27 NOTE — Anesthesia Procedure Notes (Signed)
Procedure Name: Intubation Date/Time: 11/27/2021 8:03 AM Performed by: Gean Maidens, CRNA Pre-anesthesia Checklist: Patient identified, Emergency Drugs available, Suction available, Patient being monitored and Timeout performed Patient Re-evaluated:Patient Re-evaluated prior to induction Oxygen Delivery Method: Circle system utilized Preoxygenation: Pre-oxygenation with 100% oxygen Induction Type: IV induction Ventilation: Mask ventilation without difficulty Laryngoscope Size: Mac and 3 Grade View: Grade I Tube type: Oral Tube size: 7.0 mm Number of attempts: 1 Airway Equipment and Method: Stylet Placement Confirmation: ETT inserted through vocal cords under direct vision, positive ETCO2 and breath sounds checked- equal and bilateral Secured at: 21 cm Tube secured with: Tape Dental Injury: Teeth and Oropharynx as per pre-operative assessment

## 2021-11-27 NOTE — Progress Notes (Signed)
Before admission to floor, this writer was concerned with pulse charted in the 30's in PACU as well as history of PVCs. this Probation officer verified with 2 PACU Rns and Rivard MD that this pt does not need tele.

## 2021-11-27 NOTE — Discharge Instructions (Signed)
Call [facility] at [phone] for:  Post operative appointment time and date ( you are scheduled for December 23, 2020 at 9:45 a.m. and January 27, 2021 at 9 a.m. Temperature greater than or equal to 100.4 degrees Farenheit orally Excessive pain not managed with your pain medications Excessive bleeding, problems urinating or other concerns   While taking pain medications, take Colace (Docusate Sodium) 100 mg 2-3 times daily until bowel movements are regular to prevent constipation. Take, with food, Ibuprofen 600 mg every 6 hours for 5 days then as needed for pain Take Acetaminophen 500 mg (Tylenol),  #2 tablets every 6 hours for 5 days then as needed for pain  You may shower post-operative day #1 You may walk up stairs (carefully) You may drive after 2 weeks You may lift items greater than 15 pounds after 6 weeks You should not resume sexual activity or place anything in your vagina until after 6 weeks  You may resume a regular diet

## 2021-11-28 ENCOUNTER — Encounter (HOSPITAL_COMMUNITY): Payer: Self-pay | Admitting: Obstetrics and Gynecology

## 2021-11-28 ENCOUNTER — Other Ambulatory Visit: Payer: Self-pay

## 2021-11-28 DIAGNOSIS — D259 Leiomyoma of uterus, unspecified: Secondary | ICD-10-CM | POA: Diagnosis not present

## 2021-11-28 LAB — BASIC METABOLIC PANEL
Anion gap: 5 (ref 5–15)
BUN: 10 mg/dL (ref 6–20)
CO2: 29 mmol/L (ref 22–32)
Calcium: 8.8 mg/dL — ABNORMAL LOW (ref 8.9–10.3)
Chloride: 105 mmol/L (ref 98–111)
Creatinine, Ser: 0.64 mg/dL (ref 0.44–1.00)
GFR, Estimated: >60 mL/min (ref >60)
Glucose, Bld: 98 mg/dL (ref 70–99)
Potassium: 4.2 mmol/L (ref 3.5–5.1)
Sodium: 139 mmol/L (ref 135–145)

## 2021-11-28 LAB — CBC
HCT: 39.7 % (ref 36.0–46.0)
Hemoglobin: 12.8 g/dL (ref 12.0–15.0)
MCH: 30.5 pg (ref 26.0–34.0)
MCHC: 32.2 g/dL (ref 30.0–36.0)
MCV: 94.7 fL (ref 80.0–100.0)
Platelets: 191 10*3/uL (ref 150–400)
RBC: 4.19 MIL/uL (ref 3.87–5.11)
RDW: 12.4 % (ref 11.5–15.5)
WBC: 14.1 10*3/uL — ABNORMAL HIGH (ref 4.0–10.5)
nRBC: 0 % (ref 0.0–0.2)

## 2021-11-28 LAB — SURGICAL PATHOLOGY

## 2021-11-28 MED ORDER — IBUPROFEN 600 MG PO TABS: 600.0000 mg | ORAL_TABLET | Freq: Four times a day (QID) | ORAL | 0 refills | Status: DC | PRN

## 2021-11-28 MED ORDER — DOCUSATE SODIUM 100 MG PO CAPS: 100.0000 mg | ORAL_CAPSULE | Freq: Two times a day (BID) | ORAL | 0 refills | Status: DC

## 2021-11-28 MED ORDER — OXYCODONE HCL 5 MG PO TABS: 5.0000 mg | ORAL_TABLET | ORAL | 0 refills | Status: DC | PRN

## 2021-11-28 MED ORDER — ACETAMINOPHEN 500 MG PO TABS: 1000.0000 mg | ORAL_TABLET | Freq: Four times a day (QID) | ORAL | 1 refills | Status: DC

## 2021-11-28 MED ORDER — CYCLOBENZAPRINE HCL 5 MG PO TABS
5.0000 mg | ORAL_TABLET | Freq: Three times a day (TID) | ORAL | Status: DC
  Filled 2021-11-28: qty 1

## 2021-11-28 MED ORDER — ONDANSETRON HCL 4 MG PO TABS: 4.0000 mg | ORAL_TABLET | Freq: Four times a day (QID) | ORAL | 0 refills | Status: DC | PRN

## 2021-11-28 NOTE — Progress Notes (Addendum)
Margaret Franco is a52 y.o.  [id number]  Post Op Date # 1: Abdominal Supracervical Hysterectomy/Bilateral Salpingectomy  Subjective: Patient is  doing well with good pain management regarding her hysterectomy however reports right upper abdominal quadrant pain that involves her adjacent back area.  Pain is described as coming in waves, is sharp and aggravated by breathing and certain movements. She denies cough, shortness of breath or chest pain. Patient has  good pain control related to her surgery with Ketorolac 30 mg IV and Acetaminophen but has not had any relief from her right upper abdominal quadrant-back pain with any of her ordered medicines (including oxycodone).   Patient reports having some dizziness last night with ambulating but now has no problems with ambulating.  She has tolerated her (pureed) food brought from home along with other liquids and has ordered breakfast from the cafeteria this morning.  She has voided without difficulty this morning.  Objective: Vital signs in last 24 hours: Temp:  [96.2 F (35.7 C)-99.2 F (37.3 C)] 97.9 F (36.6 C) (12/09 0907) Pulse Rate:  [28-89] 64 (12/09 0907) Resp:  [11-21] 18 (12/09 0907) BP: (101-121)/(48-86) 101/61 (12/09 0907) SpO2:  [96 %-100 %] 100 % (12/09 0907)  Intake/Output from previous day: 12/08 0701 - 12/09 0700 In: 4255.5 [P.O.:240; I.V.:3858] Out: 6256 [Urine:4625] Intake/Output this shift: No intake/output data recorded. Recent Labs  Lab 11/28/21 0523  WBC 14.1*  HGB 12.8  HCT 39.7  PLT 191     Recent Labs  Lab 11/28/21 0523  NA 139  K 4.2  CL 105  CO2 29  BUN 10  CREATININE 0.64  CALCIUM 8.8*  GLUCOSE 98    EXAM: General: alert, cooperative, and mild distress Resp: clear to auscultation bilaterally Cardio: regular rate and rhythm, S1, S2 normal, no murmur, click, rub or gallop GI: bowel sounds present in all 4 quadrants; abdominal pressure bandage is clean/dry/intact Extremities: Homans sign  is negative, no sign of DVT and no calf tenderness-SCD hose in place and functioning   Assessment: s/p Procedure(s): ABDOMINAL SUPRACERVICAL HYSTERECTOMY BILATERAL SALPINGECTOMY: stable, progressing well, and Right Upper Abdominal Pain  Plan: Encourage ambulation To consult Dr. Cletis Media regarding patient's upper abdominal pain  evaluation and management K-pad to right upper abdomen Consider discharge home later today  LOS: 1 day    Earnstine Regal, PA-C 11/28/2021 9:23 AM  Patient seen and agreed Feeling much better this PM: gas pain is minimal and surgical pain is well controlled Ambulating, voiding, passing flatus and eating. Surgical findings and pathology report discussed with patient Post-operative instructions also reviewed  Lungs: clear No CVAT Abdomen: normal BS, pressure dressing removed. Honeycomb dressing with minimal blood. Patient instructed to remove dressing 12/03/21  Discharge home today

## 2021-11-28 NOTE — Progress Notes (Signed)
  Transition of Care (TOC) Screening Note   Patient Details  Name: Margaret Franco Date of Birth: [birth year]   Transition of Care [facility]) CM/SW Contact:    Lennart Pall, LCSW Phone Number: 11/28/2021, 9:34 AM    Transition of Care Department [facility]) has reviewed patient and no TOC needs have been identified at this time. We will continue to monitor patient advancement through interdisciplinary progression rounds. If new patient transition needs arise, please place a TOC consult.    Abbygael Curtiss, LCSW

## 2021-11-28 NOTE — Discharge Summary (Signed)
Physician Discharge Summary  Patient ID: Margaret Franco MRN: [id number] DOB/AGE: 55/10/1966 55 y.o.  Admit date: 11/27/2021 Discharge date: 11/28/2021  Admission Diagnoses: uterine fibroids  Discharge Diagnoses:         Active Problems:   Fibroids   Fibroid, uterine   Discharged Condition: good  Hospital Course: abdominal supracervical hysterectomy with bilateral salpyngectomy  Physical Exam:   BP 113/90 (BP Location: Right Arm)   Pulse 63   Temp 98.6 F (37 C) (Oral)   Resp 16   Ht 5' 7.5" (1.715 m)   Wt 59.9 kg   LMP 01/16/2013   SpO2 100%   BMI 20.38 kg/m  Constitutional: alert, in no distress Cardiovascular: lungs clear, heart with normal rate and rhythm Abdomen: Incision (s) dry and clean, appropriately tender Extremities: no significant edema and no evidence of DVT  Hgb: 12.5   Consults: None   Disposition:    Allergies as of 11/28/2021       Reactions   Peanut Oil Swelling   Swelling of the face   Apple Hives   Barley Grass Hives   Clarithromycin Hives   Nexium [esomeprazole] Other (See Comments)   Triggers migraines   Famotidine Other (See Comments)   irregular heartbeat   Guaifenesin Hives   Iodinated Diagnostic Agents Hives   Iodine Hives   Latex    irritation   Wheat Bran Hives   All wheat products   Other Hives, Rash   Cod Fish        Medication List     TAKE these medications    acetaminophen 500 MG tablet Commonly known as: TYLENOL Take 2 tablets (1,000 mg total) by mouth every 6 (six) hours. Take every 6 hours with Ibuprofen for 4 days then as neede for pain   docusate sodium 100 MG capsule Commonly known as: COLACE Take 1 capsule (100 mg total) by mouth 2 (two) times daily.   EPINEPHrine 0.3 mg/0.3 mL Soaj injection Commonly known as: EPI-PEN Inject 0.3 mg into the muscle as needed for anaphylaxis.   ibuprofen 600 MG tablet Commonly known as: ADVIL Take 1 tablet (600 mg total) by mouth every 6 (six) hours as  needed. Take with tylenol every 6 hours for 4 days then as needed for pain   ondansetron 4 MG tablet Commonly known as: ZOFRAN Take 1 tablet (4 mg total) by mouth every 6 (six) hours as needed for nausea.   oxyCODONE 5 MG immediate release tablet Commonly known as: Oxy IR/ROXICODONE Take 1 tablet (5 mg total) by mouth every 4 (four) hours as needed for severe pain.   PROBIOTIC DAILY PO Take 1 capsule by mouth daily. probiotic d mannose   Vitamin D3 25 MCG (1000 UT) Caps Take 1,000 Units by mouth daily.   Yuvafem 10 MCG Tabs vaginal tablet Generic drug: Estradiol Place 10 mcg vaginally 2 (two) times a week.        Follow-up Information     Delsa Bern, MD Follow up on 12/23/2021.   Specialty: Obstetrics and Gynecology Why: appointment time is 9:45 a.m.  and January 27, 2021 at 9 a.m. Contact information: [address] [place] [address] [phone]                 Signed: Alwyn Pea, MD 11/28/2021, 4:48 PM

## 2021-11-28 NOTE — Progress Notes (Signed)
Provided discharge education/instructions, all questions and concerns addressed, Pt not in acute distress, discharged home with belongings accompanied by husband.

## 2022-07-13 ENCOUNTER — Ambulatory Visit (INDEPENDENT_AMBULATORY_CARE_PROVIDER_SITE_OTHER): Payer: Commercial Managed Care - PPO

## 2022-07-13 ENCOUNTER — Ambulatory Visit (INDEPENDENT_AMBULATORY_CARE_PROVIDER_SITE_OTHER): Payer: Commercial Managed Care - PPO | Admitting: Podiatry

## 2022-07-13 DIAGNOSIS — M722 Plantar fascial fibromatosis: Secondary | ICD-10-CM | POA: Diagnosis not present

## 2022-07-13 DIAGNOSIS — M2011 Hallux valgus (acquired), right foot: Secondary | ICD-10-CM

## 2022-07-13 NOTE — Patient Instructions (Signed)
Powerstep or superfeet insoles available on [employer]

## 2022-07-13 NOTE — Progress Notes (Signed)
Chief Complaint  Patient presents with   Bunions    Patient is here and states that she noticed a lump under the bottom of her right foot 2 weeks ago, no pain. She saw her pcp and he recommended her to see a podiatrist, she also wants to discuss her bunion on her right foot.    HPI: 56 y.o. female presenting today as a new patient for evaluation of a small lump that the patient noticed along the plantar arch of the right foot.  Patient states that she was evaluating her feet for some small ruptured blood vessels that have resolved and gone away she noticed a lump along the plantar aspect of the foot.  This was discussed with her PCP and she was referred here.  Also she would like to discuss her bunion to the right foot.  Currently it is minimally symptomatic.  She states that her mother has bunions and it is slowly developed over the past few years.  Past Medical History:  Diagnosis Date   Adenopathy    submandibular   Anxiety    Chest pain    pt reports hx of " fleeting chest twinges' pt reports having told PCP in past most recent has been in last week   Complication of anesthesia    DR Earlean Shawl did surgery- passed out after procedure 8 years but with last surgery did fine- colonoscopy   Depression    Fibroids    GERD (gastroesophageal reflux disease)    Headache    History of colon polyps    Hypoglycemia    hx of per pt   Leukocytosis 10/21/2010   mild   Pneumonia    PVC (premature ventricular contraction)    hx of    Past Surgical History:  Procedure Laterality Date   ABDOMINAL HYSTERECTOMY N/A 11/27/2021   Procedure: ABDOMINAL SUPRACERVICAL HYSTERECTOMY BILATERAL SALPINGECTOMY;  Surgeon: Delsa Bern, MD;  Location: [facility];  Service: Gynecology;  Laterality: N/A;   BACK SURGERY     COLONOSCOPY     ESOPHAGOGASTRODUODENOSCOPY ENDOSCOPY     wisdom teeth extractoin       Allergies  Allergen Reactions   Peanut Oil Swelling    Swelling of the face   Apple Juice Hives    Barley Grass Hives   Clarithromycin Hives   Nexium [Esomeprazole] Other (See Comments)    Triggers migraines   Famotidine Other (See Comments)    irregular heartbeat   Guaifenesin Hives   Iodinated Contrast Media Hives   Iodine Hives   Latex     irritation   Wheat Bran Hives    All wheat products   Other Hives and Rash    Cod Fish     Physical Exam: General: The patient is alert and oriented x3 in no acute distress.  Dermatology: Skin is warm, dry and supple bilateral lower extremities. Negative for open lesions or macerations.  Vascular: Palpable pedal pulses bilaterally. Capillary refill within normal limits.  Negative for any significant edema or erythema  Neurological: Light touch and protective threshold grossly intact  Musculoskeletal Exam: Small pea-sized plantar fibroma noted along the medial longitudinal arch of the foot adhered along the plantar fascia.  Nontender.  Mild hallux valgus deformity also noted on clinical exam  Radiographic Exam:  Normal osseous mineralization. Joint spaces preserved. No fracture/dislocation/boney destruction.  Slightly increased intermetatarsal angle with a hypertrophic medial eminence of the first metatarsal head noted  Assessment: 1.  Mild hallux valgus right 2.  Small pea-sized plantar fibroma right   Plan of Care:  1. Patient evaluated. X-Rays reviewed.  2.  Patient admits to going barefoot around the house.  Advised against going barefoot.  Recommend good supportive shoes and sneakers with arch supports 3.  Recommended OTC power step or super feet insoles available on [employer] 4.  Explained to the patient the etiology and pathology of a plantar fibroma.  Currently it is completely asymptomatic.  We will observe for now 5. 6.  Return to clinic as needed      Edrick Kins, DPM [facility]  Dr. Edrick Kins, DPM    [address], [place] [address]                 Office [phone]  Fax [phone]

## 2022-09-25 ENCOUNTER — Ambulatory Visit: Payer: Commercial Managed Care - PPO | Admitting: Cardiology

## 2022-10-06 NOTE — Progress Notes (Signed)
New Patient Note  RE: Margaret Franco MRN: [id number] DOB: 07/23/1966 Date of Office Visit: 10/08/2022  Consult requested by: Laurann Montana, MD Primary care provider: Laurann Montana, MD  Chief Complaint: Rash (No issues ) and Other (GI upset - not sure the cause currently avoiding apple products )  History of Present Illness: I had the pleasure of seeing Margaret Franco for initial evaluation at the [facility] on 10/08/2022. She is a 56 y.o. female, who is self-referred here for the evaluation of GI issues.  Patient was last seen in our office on 08/31/2019 for rash, and adverse food reaction.   Patient has an apple allergy and since she stopped eating it she noticed improvement in her GI symptoms in the past.   She is now having acid reflux symptoms again. She tried famotidine but it caused headaches. Currently taking Tums with unknown benefit. She had side effects with PPIs as well. No recent GI evaluation.   She has been avoiding reflux trigger foods and eating a very bland diet. Denies any rash/itching with this.   Currently avoiding apples, fish, peanuts, barley, shellfish.  Burning of hands with cod contact.  Now patient is concerned whether she developed a new food sensitivity/allergy.   Assessment and Plan: Mccartney is a 56 y.o. female with: Dietary counseling and surveillance History of GERD like symptoms which improved after avoiding apples. Recently noticed flare in symptoms and wondering if she could be "allergic" to any new foods. Intolerant of PPI and antacids. No recent GI evaluation. Currently avoiding apples, seafood, peanuts and barley.  Discussed with patient that skin prick testing and bloodwork (food IgE levels) check for IgE mediated reactions which her clinical presentation does not support.  Patient still interested in undergoing skin testing as that's how the apple issue was found.  Today's skin testing showed: Borderline positive to  casein however the positive control (at 2 different sites) was negative questioning the validity of the results.  Confirmed with patient that she did not take any antihistamines recently.  Given non-IgE mediated symptoms no need for food IgE bloodwork at this time.  Continue to avoid apples, seafood, barley and peanuts.  Try to eliminate cow's milk products from diet and see if you notice any improvement. If no improvement then okay to reintroduce back into diet.  See handout for lifestyle and dietary modifications regarding GERD. Recommend GI evaluation next.   Other allergic rhinitis Nasal congestion and 2020 skin testing positive to dust mites. Does not take any meds for this.  See below for environmental control measures. Use over the counter antihistamines such as Zyrtec (cetirizine), Claritin (loratadine), Allegra (fexofenadine), or Xyzal (levocetirizine) daily as needed. May switch antihistamines every few months.  Gastroesophageal reflux disease See assessment and plan as above.  Return if symptoms worsen or fail to improve.  No orders of the defined types were placed in this encounter.  Lab Orders  No laboratory test(s) ordered today    Other allergy screening: Asthma: no Rhino conjunctivitis: yes 2020 skin testing was positive to dust mites. Patient has a constant nasal congestion.   Medication allergy: yes Hymenoptera allergy: no History of recurrent infections suggestive of immunodeficency: no  Diagnostics: Skin Testing: Select foods. Borderline positive to casein however the positive control (at 2 different sites) was negative questioning the validity of the results.  Patient confirmed no antihistamine use. Results discussed with patient/family.  Food Adult Perc - 10/08/22 0900     Time Antigen  Placed 0919    Allergen Manufacturer Greer    Location Back    Number of allergen test 29     Control-buffer 50% Glycerol Negative    Control-Histamine 1 mg/ml  Negative    2. Soybean Negative    3. Wheat Negative    4. Sesame Negative    5. Milk, cow Negative    6. Egg White, Chicken Negative    7. Casein --   +/-   13. Almond Negative    16. Coconut Negative    17. Pistachio Negative    31. Oat  Negative    34. Rice Negative    43. White Potato Negative    44. Sweet Potato Negative    45. Pea, Green/English Negative    46. Navy Bean Negative    47. Mushrooms Negative    48. Avocado Negative    51. Carrots Negative    52. Celery Negative    53. Corn Negative    55. Grape (White seedless) Negative    56. Orange  Negative    57. Banana Negative    59. Peach Negative    60. Strawberry Negative    62. Watermelon Negative    63. Pineapple Negative    67. Cinnamon Negative    70. Garlic Negative             Past Medical History: Patient Active Problem List   Diagnosis Date Noted   Gastroesophageal reflux disease 10/08/2022   Other allergic rhinitis 10/08/2022   Dietary counseling and surveillance 10/08/2022   Encounter for sterilization 11/27/2021   Fibroids 11/27/2021   Adverse food reaction 08/31/2019   Rash and other nonspecific skin eruption 08/31/2019   House dust mite allergy 08/31/2019   Dense breasts 02/26/2013   Irregular menses 02/06/2013   Anxiety    History of colon polyps    Fibroid, uterine    Leukocytosis 10/21/2010   Past Medical History:  Diagnosis Date   Adenopathy    submandibular   Anxiety    Chest pain    pt reports hx of " fleeting chest twinges' pt reports having told PCP in past most recent has been in last week   Complication of anesthesia    DR Kinnie Scales did surgery- passed out after procedure 8 years but with last surgery did fine- colonoscopy   Depression    Fibroids    GERD (gastroesophageal reflux disease)    Headache    History of colon polyps    Hypoglycemia    hx of per pt   Leukocytosis 10/21/2010   mild   Pneumonia    PVC (premature ventricular contraction)    hx of    Past Surgical History: Past Surgical History:  Procedure Laterality Date   ABDOMINAL HYSTERECTOMY N/A 11/27/2021   Procedure: ABDOMINAL SUPRACERVICAL HYSTERECTOMY BILATERAL SALPINGECTOMY;  Surgeon: Silverio Lay, MD;  Location: [facility];  Service: Gynecology;  Laterality: N/A;   BACK SURGERY     COLONOSCOPY     ESOPHAGOGASTRODUODENOSCOPY ENDOSCOPY     wisdom teeth extractoin      Medication List:  Current Outpatient Medications  Medication Sig Dispense Refill   Cholecalciferol (VITAMIN D3) 25 MCG (1000 UT) CAPS Take 1,000 Units by mouth daily.     EPINEPHrine 0.3 mg/0.3 mL IJ SOAJ injection Inject 0.3 mg into the muscle as needed for anaphylaxis.     Probiotic Product (PROBIOTIC DAILY PO) Take 1 capsule by mouth daily. probiotic d mannose     YUVAFEM  10 MCG TABS vaginal tablet Place 10 mcg vaginally 2 (two) times a week.     No current facility-administered medications for this visit.   Allergies: Allergies  Allergen Reactions   Peanut Oil Swelling    Swelling of the face   Apple Juice Hives   Barley Grass Hives   Clarithromycin Hives   Nexium [Esomeprazole] Other (See Comments)    Triggers migraines   Famotidine Other (See Comments)    irregular heartbeat   Guaifenesin Hives   Iodinated Contrast Media Hives   Iodine Hives   Latex     irritation   Wheat Bran Hives    All wheat products   Other Hives and Rash    Cod Fish   Social History: Social History   Socioeconomic History   Marital status: Married    Spouse name: Not on file   Number of children: Not on file   Years of education: Not on file   Highest education level: Not on file  Occupational History   Not on file  Tobacco Use   Smoking status: Never   Smokeless tobacco: Never  Vaping Use   Vaping Use: Never used  Substance and Sexual Activity   Alcohol use: Never   Drug use: Never   Sexual activity: Yes    Birth control/protection: Condom  Other Topics Concern   Not on file  Social History  Narrative   Not on file   Social Determinants of Health   Financial Resource Strain: Not on file  Food Insecurity: Not on file  Transportation Needs: Not on file  Physical Activity: Not on file  Stress: Not on file  Social Connections: Not on file   Lives in a house which was built in [place]... Smoking: denies Occupation: Health and safety inspector HistorySurveyor, minerals in the house: no Engineer, civil (consulting) in the family room: no Carpet in the bedroom: no Heating: gas Cooling: central Pet: no  Family History: Family History  Problem Relation Age of Onset   Migraines Mother    Heart disease Mother    Hypertension Mother    Hypertension Father    Diabetes Father    Cancer - Prostate Father    CVA Father    Cancer - Prostate Paternal Grandfather    CVA Paternal Grandfather    Stomach cancer Maternal Grandfather    Migraines Brother    Asthma Sister    Breast cancer Maternal Grandmother    Review of Systems  Constitutional:  Negative for appetite change, chills, fever and unexpected weight change.  HENT:  Positive for congestion. Negative for rhinorrhea.   Eyes:  Negative for itching.  Respiratory:  Positive for shortness of breath. Negative for cough, chest tightness and wheezing.   Cardiovascular:  Negative for chest pain.  Gastrointestinal:  Negative for abdominal pain, diarrhea, nausea and vomiting.  Genitourinary:  Negative for difficulty urinating.  Skin:  Negative for rash.  Allergic/Immunologic: Positive for environmental allergies.  Neurological:  Negative for headaches.    Objective: BP 118/66   Pulse 82   Temp 98 F (36.7 C)   Resp 18   Ht 5\' 8"  (1.727 m)   Wt 133 lb 1.9 oz (60.4 kg)   LMP 01/16/2013   SpO2 99%   BMI 20.24 kg/m  Body mass index is 20.24 kg/m. Physical Exam Vitals and nursing note reviewed.  Constitutional:      Appearance: Normal appearance. She is well-developed.  HENT:     Head: Normocephalic and atraumatic.  Right Ear: Tympanic  membrane and external ear normal.     Left Ear: Tympanic membrane and external ear normal.     Nose: Nose normal.     Mouth/Throat:     Mouth: Mucous membranes are moist.     Pharynx: Oropharynx is clear.  Eyes:     Conjunctiva/sclera: Conjunctivae normal.  Cardiovascular:     Rate and Rhythm: Normal rate and regular rhythm.     Heart sounds: Normal heart sounds. No murmur heard.    No friction rub. No gallop.  Pulmonary:     Effort: Pulmonary effort is normal.     Breath sounds: Normal breath sounds. No wheezing, rhonchi or rales.  Musculoskeletal:     Cervical back: Neck supple.  Skin:    General: Skin is warm.     Findings: No rash.  Neurological:     Mental Status: She is alert and oriented to person, place, and time.  Psychiatric:        Behavior: Behavior normal.   The plan was reviewed with the patient/family, and all questions/concerned were addressed.  It was my pleasure to see Jazzmine today and participate in her care. Please feel free to contact me with any questions or concerns.  Sincerely,  Wyline Mood, DO Allergy & Immunology  [facility] office: [phone] [facility] office: [phone]

## 2022-10-08 ENCOUNTER — Encounter: Payer: Self-pay | Admitting: Allergy

## 2022-10-08 ENCOUNTER — Ambulatory Visit (INDEPENDENT_AMBULATORY_CARE_PROVIDER_SITE_OTHER): Payer: Commercial Managed Care - PPO | Admitting: Allergy

## 2022-10-08 ENCOUNTER — Other Ambulatory Visit: Payer: Self-pay

## 2022-10-08 VITALS — BP 118/66 | HR 82 | Temp 98.0°F | Resp 18 | Ht 68.0 in | Wt 133.1 lb

## 2022-10-08 DIAGNOSIS — K219 Gastro-esophageal reflux disease without esophagitis: Secondary | ICD-10-CM | POA: Insufficient documentation

## 2022-10-08 DIAGNOSIS — J3089 Other allergic rhinitis: Secondary | ICD-10-CM

## 2022-10-08 DIAGNOSIS — T781XXA Other adverse food reactions, not elsewhere classified, initial encounter: Secondary | ICD-10-CM

## 2022-10-08 DIAGNOSIS — Z713 Dietary counseling and surveillance: Secondary | ICD-10-CM

## 2022-10-08 DIAGNOSIS — T781XXD Other adverse food reactions, not elsewhere classified, subsequent encounter: Secondary | ICD-10-CM

## 2022-10-08 NOTE — Patient Instructions (Addendum)
Today's skin testing showed: Borderline positive to casein however the positive control was negative questioning the validity of the results.   Results given.  Heartburn: See handout for lifestyle and dietary modifications. Recommend GI evaluation next.   Food  Continue to avoid apples, seafood, barley and peanuts.  Try to eliminate cow's milk products from diet and see if you notice any improvement. If no improvement then okay to reintroduce back into diet.   Dust mite allergy See below for environmental control measures. Use over the counter antihistamines such as Zyrtec (cetirizine), Claritin (loratadine), Allegra (fexofenadine), or Xyzal (levocetirizine) daily as needed. May switch antihistamines every few months.  Follow up if needed.  Control of House Dust Mite Allergen Dust mite allergens are a common trigger of allergy and asthma symptoms. While they can be found throughout the house, these microscopic creatures thrive in warm, humid environments such as bedding, upholstered furniture and carpeting. Because so much time is spent in the bedroom, it is essential to reduce mite levels there.  Encase pillows, mattresses, and box springs in special allergen-proof fabric covers or airtight, zippered plastic covers.  Bedding should be washed weekly in hot water (130 F) and dried in a hot dryer. Allergen-proof covers are available for comforters and pillows that can't be regularly washed.  Wash the allergy-proof covers every few months. Minimize clutter in the bedroom. Keep pets out of the bedroom.  Keep humidity less than 50% by using a dehumidifier or air conditioning. You can buy a humidity measuring device called a hygrometer to monitor this.  If possible, replace carpets with hardwood, linoleum, or washable area rugs. If that's not possible, vacuum frequently with a vacuum that has a HEPA filter. Remove all upholstered furniture and non-washable window drapes from the  bedroom. Remove all non-washable stuffed toys from the bedroom.  Wash stuffed toys weekly.

## 2022-10-08 NOTE — Assessment & Plan Note (Signed)
.   See assessment and plan as above. 

## 2022-10-08 NOTE — Assessment & Plan Note (Signed)
Nasal congestion and 2020 skin testing positive to dust mites. Does not take any meds for this.  . See below for environmental control measures. . Use over the counter antihistamines such as Zyrtec (cetirizine), Claritin (loratadine), Allegra (fexofenadine), or Xyzal (levocetirizine) daily as needed. May switch antihistamines every few months.

## 2022-10-08 NOTE — Assessment & Plan Note (Addendum)
History of GERD like symptoms which improved after avoiding apples. Recently noticed flare in symptoms and wondering if she could be "allergic" to any new foods. Intolerant of PPI and antacids. No recent GI evaluation. Currently avoiding apples, seafood, peanuts and barley.   Discussed with patient that skin prick testing and bloodwork (food IgE levels) check for IgE mediated reactions which her clinical presentation does not support.   Patient still interested in undergoing skin testing as that's how the apple issue was found.   Today's skin testing showed: Borderline positive to casein however the positive control (at 2 different sites) was negative questioning the validity of the results.   Confirmed with patient that she did not take any antihistamines recently.   Given non-IgE mediated symptoms no need for food IgE bloodwork at this time.   Continue to avoid apples, seafood, barley and peanuts.   Try to eliminate cow's milk products from diet and see if you notice any improvement.  If no improvement then okay to reintroduce back into diet.   See handout for lifestyle and dietary modifications regarding GERD.  Recommend GI evaluation next.

## 2022-10-21 ENCOUNTER — Telehealth: Payer: Self-pay | Admitting: Internal Medicine

## 2022-10-21 NOTE — Telephone Encounter (Signed)
Good Afternoon Dr.Pyrtle,  We received records on this patient to be seen for a colonoscopy. She has previous GI hx with Dr.Medoff. She is requesting this transfer as [facility] closer to [place] home and she is requesting to have you as her provider.  We have records for review, please advise on scheduling. Thank you.

## 2022-11-26 NOTE — Telephone Encounter (Signed)
She had a colonoscopy with Dr. Earlean Shawl in May 2021 with a 5-year recall based on family history And place recall for colonoscopy at that time If she has specific GI symptoms or issues she can make an office visit with me

## 2022-11-26 NOTE — Telephone Encounter (Signed)
Inbound call from patient following up on transfer. Please advise.

## 2022-12-01 ENCOUNTER — Encounter: Payer: Self-pay | Admitting: Internal Medicine

## 2023-01-18 ENCOUNTER — Encounter: Payer: Self-pay | Admitting: *Deleted

## 2023-02-17 ENCOUNTER — Other Ambulatory Visit (INDEPENDENT_AMBULATORY_CARE_PROVIDER_SITE_OTHER): Payer: Commercial Managed Care - PPO

## 2023-02-17 ENCOUNTER — Other Ambulatory Visit: Payer: Commercial Managed Care - PPO

## 2023-02-17 ENCOUNTER — Ambulatory Visit (INDEPENDENT_AMBULATORY_CARE_PROVIDER_SITE_OTHER): Payer: Commercial Managed Care - PPO | Admitting: Internal Medicine

## 2023-02-17 ENCOUNTER — Encounter: Payer: Self-pay | Admitting: Internal Medicine

## 2023-02-17 VITALS — BP 104/58 | HR 109 | Ht 68.0 in | Wt 126.0 lb

## 2023-02-17 DIAGNOSIS — Z860100 Personal history of colon polyps, unspecified: Secondary | ICD-10-CM

## 2023-02-17 DIAGNOSIS — Z800 Family history of malignant neoplasm of digestive organs: Secondary | ICD-10-CM | POA: Diagnosis not present

## 2023-02-17 DIAGNOSIS — R12 Heartburn: Secondary | ICD-10-CM | POA: Diagnosis not present

## 2023-02-17 DIAGNOSIS — K588 Other irritable bowel syndrome: Secondary | ICD-10-CM | POA: Diagnosis not present

## 2023-02-17 DIAGNOSIS — R198 Other specified symptoms and signs involving the digestive system and abdomen: Secondary | ICD-10-CM

## 2023-02-17 LAB — COMPREHENSIVE METABOLIC PANEL
ALT: 17 U/L (ref 0–35)
AST: 18 U/L (ref 0–37)
Albumin: 4.1 g/dL (ref 3.5–5.2)
Alkaline Phosphatase: 54 U/L (ref 39–117)
BUN: 14 mg/dL (ref 6–23)
CO2: 30 mEq/L (ref 19–32)
Calcium: 9.9 mg/dL (ref 8.4–10.5)
Chloride: 103 mEq/L (ref 96–112)
Creatinine, Ser: 0.86 mg/dL (ref 0.40–1.20)
GFR: 75.33 mL/min (ref >60.00)
Glucose, Bld: 90 mg/dL (ref 70–99)
Potassium: 3.7 mEq/L (ref 3.5–5.1)
Sodium: 141 mEq/L (ref 135–145)
Total Bilirubin: 0.6 mg/dL (ref 0.2–1.2)
Total Protein: 6.9 g/dL (ref 6.0–8.3)

## 2023-02-17 LAB — CBC WITH DIFFERENTIAL/PLATELET
Basophils Absolute: 0 10*3/uL (ref 0.0–0.1)
Basophils Relative: 0.7 % (ref 0.0–3.0)
Eosinophils Absolute: 0 10*3/uL (ref 0.0–0.7)
Eosinophils Relative: 0.7 % (ref 0.0–5.0)
HCT: 39.5 % (ref 36.0–46.0)
Hemoglobin: 13.2 g/dL (ref 12.0–15.0)
Lymphocytes Relative: 31.7 % (ref 12.0–46.0)
Lymphs Abs: 1.5 10*3/uL (ref 0.7–4.0)
MCHC: 33.3 g/dL (ref 30.0–36.0)
MCV: 91.8 fl (ref 78.0–100.0)
Monocytes Absolute: 0.3 10*3/uL (ref 0.1–1.0)
Monocytes Relative: 7 % (ref 3.0–12.0)
Neutro Abs: 2.8 10*3/uL (ref 1.4–7.7)
Neutrophils Relative %: 59.9 % (ref 43.0–77.0)
Platelets: 236 10*3/uL (ref 150.0–400.0)
RBC: 4.3 Mil/uL (ref 3.87–5.11)
RDW: 12.5 % (ref 11.5–15.5)
WBC: 4.7 10*3/uL (ref 4.0–10.5)

## 2023-02-17 MED ORDER — SACCHAROMYCES BOULARDII 250 MG PO CAPS: ORAL_CAPSULE | ORAL | 0 refills | Status: AC

## 2023-02-17 NOTE — Progress Notes (Signed)
Patient ID: Margaret Franco, female   DOB: 21-Feb-1966, 57 y.o.   MRN: [id number] HPI: Margaret Franco is a 57 year old female with a history of remote colon polyps, family history of colon cancer in her father in his 76s, history of GERD diet controlled, uterine fibroids status post hysterectomy, history of anxiety who is here to establish care.  She is here alone today.  She reports that her previous GI care had been with Dr. Earlean Shawl prior to his retirement.  She has a history of remote colon polyps but on her last colonoscopy performed in May 2021 the exam was normal without polyps.  Her father had colorectal cancer in his 33s.  Prior to just 10 days ago she had not had any GI specific issues other than rare issue with heartburn and indigestion.  This happens with certain trigger foods like onions and other spicy foods.  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  She will use Tums on occasion but has not required any other medication.  No dysphagia or odynophagia.  10 days ago she developed stomach discomfort, borborygmi followed by 3 to 4 hours of near intractable nausea and vomiting.  She then had a few hours of urgent watery nonbloody diarrhea.  Nausea vomiting and diarrhea stopped within 24 hours but her bowel habits have not yet come back to normal.  She is also noticed some mild increase in borborygmi.  She has intermittently tried a probiotic but has not been doing so lately.    Past Medical History:  Diagnosis Date   Adenopathy    submandibular   Anxiety    Chest pain    pt reports hx of " fleeting chest twinges' pt reports having told PCP in past most recent has been in last week   Complication of anesthesia    DR Earlean Shawl did surgery- passed out after procedure 8 years but with last surgery did fine- colonoscopy   Depression    Fibroids    GERD (gastroesophageal reflux disease)    Headache    History of colon polyps    Hypoglycemia    hx of per pt   Leukocytosis  10/21/2010   mild   Pneumonia    PVC (premature ventricular contraction)    hx of    Past Surgical History:  Procedure Laterality Date   ABDOMINAL HYSTERECTOMY N/A 11/27/2021   Procedure: ABDOMINAL SUPRACERVICAL HYSTERECTOMY BILATERAL SALPINGECTOMY;  Surgeon: Delsa Bern, MD;  Location: [facility];  Service: Gynecology;  Laterality: N/A;   BACK SURGERY     COLONOSCOPY     ESOPHAGOGASTRODUODENOSCOPY ENDOSCOPY     wisdom teeth extractoin       Outpatient Medications Prior to Visit  Medication Sig Dispense Refill   Cholecalciferol (VITAMIN D3) 25 MCG (1000 UT) CAPS Take 1,000 Units by mouth daily.     EPINEPHrine 0.3 mg/0.3 mL IJ SOAJ injection Inject 0.3 mg into the muscle as needed for anaphylaxis.     YUVAFEM 10 MCG TABS vaginal tablet Place 10 mcg vaginally 2 (two) times a week.     Probiotic Product (PROBIOTIC DAILY PO) Take 1 capsule by mouth daily. probiotic d mannose     No facility-administered medications prior to visit.    Allergies  Allergen Reactions   Peanut Oil Swelling    Swelling of the face   Apple Juice Hives   Barley Grass Hives   Clarithromycin Hives   Nexium [Esomeprazole] Other (See Comments)    Triggers migraines   Famotidine  Other (See Comments)    irregular heartbeat   Guaifenesin Hives   Iodinated Contrast Media Hives   Iodine Hives   Latex     irritation   Wheat Bran Hives    All wheat products   Other Hives and Rash    Cod Fish    Family History  Problem Relation Age of Onset   Migraines Mother    Heart disease Mother    Hypertension Mother    Hypertension Father    Diabetes Father    Cancer - Prostate Father    CVA Father    Colon cancer Father    Asthma Sister    Migraines Brother    Breast cancer Maternal Grandmother    Stomach cancer Maternal Grandfather    Cancer - Prostate Paternal Grandfather    CVA Paternal Grandfather     Social History   Tobacco Use   Smoking status: Never   Smokeless tobacco: Never  Vaping Use    Vaping Use: Never used  Substance Use Topics   Alcohol use: Never   Drug use: Never    ROS: As per history of present illness, otherwise negative  BP (!) 104/58   Pulse (!) 109   Ht '5\' 8"'$  (1.727 m)   Wt 126 lb (57.2 kg)   LMP 01/16/2013   BMI 19.16 kg/m  Gen: awake, alert, NAD HEENT: anicteric  Abd: soft, NT/ND, +BS throughout Ext: no c/c/e Neuro: nonfocal   RELEVANT LABS AND IMAGING: CBC    Component Value Date/Time   WBC 4.7 02/17/2023 0913   RBC 4.30 02/17/2023 0913   HGB 13.2 02/17/2023 0913   HCT 39.5 02/17/2023 0913   PLT 236.0 02/17/2023 0913   MCV 91.8 02/17/2023 0913   MCH 30.5 11/28/2021 0523   MCHC 33.3 02/17/2023 0913   RDW 12.5 02/17/2023 0913   LYMPHSABS 1.5 02/17/2023 0913   MONOABS 0.3 02/17/2023 0913   EOSABS 0.0 02/17/2023 0913   BASOSABS 0.0 02/17/2023 0913   CMP     Component Value Date/Time   NA 141 02/17/2023 0913   K 3.7 02/17/2023 0913   CL 103 02/17/2023 0913   CO2 30 02/17/2023 0913   GLUCOSE 90 02/17/2023 0913   BUN 14 02/17/2023 0913   CREATININE 0.86 02/17/2023 0913   CALCIUM 9.9 02/17/2023 0913   PROT 6.9 02/17/2023 0913   ALBUMIN 4.1 02/17/2023 0913   AST 18 02/17/2023 0913   ALT 17 02/17/2023 0913   ALKPHOS 54 02/17/2023 0913   BILITOT 0.6 02/17/2023 0913   GFRNONAA >60 11/28/2021 0523   GFRAA  06/30/2007 0031    >60        The eGFR has been calculated using the MDRD equation. This calculation has not been validated in all clinical    ASSESSMENT/PLAN: 57 year old female with a history of remote colon polyps, family history of colon cancer in her father in his 11s, history of GERD diet controlled, uterine fibroids status post hysterectomy, history of anxiety who is here to establish care.  Family history of colon cancer/personal history of colon polyp --I do not have the histology on her more remote colon polyp though with her family history of 5-year surveillance interval is reasonable.  We discussed this today  and thus her recommended repeat colonoscopy would be in May 2026 -- Colonoscopy May 2026  2.  Recent gastroenteritis versus toxin mediated food poisoning --she is having some postinfectious irritable bowel over the last week for what is very consistent with  an acute infectious versus toxin mediated process.  She has largely recovered from this.  Reasonable to try 2 to 4 weeks of probiotic.  Let me know if this does not resolve -- Florastor 250 mg twice daily for 2 to 4 weeks -- Check CBC and CMP to ensure no ongoing electrolyte disturbance and normal blood counts  3.  GERD --rare and diet controlled.  No alarm symptoms.  Using as needed Tums  Follow-up as needed and for surveillance colonoscopies    Cc:Harlan Stains, [address] [place],  [place] [id number]

## 2023-02-17 NOTE — Patient Instructions (Addendum)
Please follow up as needed.  Your provider has requested that you go to the basement level for lab work before leaving today. Press "B" on the elevator. The lab is located at the first door on the left as you exit the elevator.  Due to recent changes in healthcare laws, you may see the results of your imaging and laboratory studies on MyChart before your provider has had a chance to review them.  We understand that in some cases there may be results that are confusing or concerning to you. Not all laboratory results come back in the same time frame and the provider may be waiting for multiple results in order to interpret others.  Please give [place] 48 hours in order for your provider to thoroughly review all the results before contacting the office for clarification of your results.    _______________________________________________________  If your blood pressure at your visit was 140/90 or greater, please contact your primary care physician to follow up on this.  _______________________________________________________  If you are age 62 or older, your body mass index should be between 23-30. Your Body mass index is 19.16 kg/m. If this is out of the aforementioned range listed, please consider follow up with your Primary Care Provider.  If you are age 87 or younger, your body mass index should be between 19-25. Your Body mass index is 19.16 kg/m. If this is out of the aformentioned range listed, please consider follow up with your Primary Care Provider.   ________________________________________________________  The [facility] providers would like to encourage you to use [facility] to communicate with providers for non-urgent requests or questions.  Due to long hold times on the telephone, sending your provider a message by [facility] may be a faster and more efficient way to get a response.  Please allow 48 business hours for a response.  Please remember that this is for non-urgent requests.   _______________________________________________________ It was a pleasure to see you today!  Thank you for trusting me with your gastrointestinal care!

## 2023-03-01 ENCOUNTER — Encounter: Payer: Self-pay | Admitting: Internal Medicine

## 2023-03-17 ENCOUNTER — Other Ambulatory Visit: Payer: Self-pay | Admitting: Obstetrics and Gynecology

## 2023-03-17 DIAGNOSIS — Z1231 Encounter for screening mammogram for malignant neoplasm of breast: Secondary | ICD-10-CM

## 2023-04-13 ENCOUNTER — Encounter: Payer: Self-pay | Admitting: Internal Medicine

## 2023-04-16 ENCOUNTER — Other Ambulatory Visit: Payer: Self-pay

## 2023-04-16 ENCOUNTER — Other Ambulatory Visit (HOSPITAL_COMMUNITY): Payer: Self-pay

## 2023-04-16 ENCOUNTER — Telehealth: Payer: Self-pay | Admitting: Pharmacy Technician

## 2023-04-16 MED ORDER — RIFAXIMIN 550 MG PO TABS: 550.0000 mg | ORAL_TABLET | Freq: Three times a day (TID) | ORAL | 0 refills | Status: AC

## 2023-04-16 NOTE — Telephone Encounter (Signed)
Patient Advocate Encounter  Received notification from [facility] MEDICAID that prior authorization for XIFAXAN 550MG  is required.   PA submitted on 4.26.24 Key [id number] Status is pending

## 2023-04-16 NOTE — Telephone Encounter (Signed)
I would recommend that we try rifaximin 550 mg 3 times daily x 14 days for IBS Which should help improve her symptoms She should let me know 2 weeks after therapy if symptoms have improved

## 2023-04-22 ENCOUNTER — Other Ambulatory Visit (HOSPITAL_COMMUNITY): Payer: Self-pay

## 2023-04-22 NOTE — Telephone Encounter (Signed)
Patient Advocate Encounter  Prior Authorization for [employer] 550MG  has been approved with [employer].    PA# [id number] Effective dates: 4.27.24 through 4.27.25

## 2023-04-23 ENCOUNTER — Telehealth: Payer: Self-pay | Admitting: Internal Medicine

## 2023-04-23 NOTE — Telephone Encounter (Signed)
Received a MyChart message from patient.  She recently had an ultrasound from her gyn and was told they could not see her ovaries due to an excessive amount of bowel and that she needed to see Dr. Rhea Belton.  As his first appointment is not until September, is there anything she needs to be doing?  Please call patient and advise.  Thank you.

## 2023-04-26 NOTE — Telephone Encounter (Signed)
Pt scheduled to see Dr. Rhea Belton 06/01/23 at 1:30pm. Pt notified of appt via mychart.

## 2023-05-03 ENCOUNTER — Ambulatory Visit: Payer: Commercial Managed Care - PPO | Admitting: Cardiology

## 2023-06-04 ENCOUNTER — Ambulatory Visit: Payer: Commercial Managed Care - PPO

## 2023-06-06 NOTE — Progress Notes (Unsigned)
  Cardiology Office Note:   Date:  06/08/2023  ID:  [name], DOB 1966-02-23, MRN [id number]  History of Present Illness:   Margaret Franco is a 57 y.o. female anxiety and GERD who was referred by Dr. Cliffton Asters for further evaluation of PVCs.  Patient remotely seen by Dr. Anne Fu in 2015 for sharp chest discomfort and history of PVCs. Prior TTE in 2011 reportedly normal. Prior ETT also normal.   Today, the patient overall feels well. Was recently evaluated by her osteopathic physician for her frozen shoulder a couple of months ago and was noted to be having PVCs at that visit. Was recommended to come back to Cardiology for further evaluation. Today, the patient states her palpitations have improved since her visit. No chest pain, SOB, lightheadedness, dizziness, syncope, LE edema, orthopnea or PND.  She has occasional, intermittent non-exertional chest pain that she relates to reflux.    Past Medical History:  Diagnosis Date   Adenopathy    submandibular   Anxiety    Chest pain    pt reports hx of " fleeting chest twinges' pt reports having told PCP in past most recent has been in last week   Complication of anesthesia    DR [id number] did surgery- passed out after procedure 8 years but with last surgery did fine- colonoscopy   Depression    Fibroids    GERD (gastroesophageal reflux disease)    Headache    History of colon polyps    Hypoglycemia    hx of per pt   Leukocytosis 10/21/2010   mild   Pneumonia    PVC (premature ventricular contraction)    hx of     ROS: As per HPI  Studies Reviewed:    EKG:  NSR, possible septal infarct -personally reviewed       Risk Assessment/Calculations:              Physical Exam:   VS:  BP 106/64   Pulse 77   Ht 5\' 8"  (1.727 m)   Wt 125 lb (56.7 kg)   LMP 01/16/2013   SpO2 99%   BMI 19.01 kg/m    Wt Readings from Last 3 Encounters:  06/08/23 125 lb (56.7 kg)  02/17/23 126 lb (57.2 kg)  10/08/22 133 lb 1.9 oz (60.4 kg)      GEN: Well nourished, well developed in no acute distress NECK: No JVD; No carotid bruits CARDIAC: RRR, no murmurs, rubs, gallops RESPIRATORY:  Clear to auscultation without rales, wheezing or rhonchi  ABDOMEN: Soft, non-tender, non-distended EXTREMITIES:  No edema; No deformity   ASSESSMENT AND PLAN:    #Palpitations: #History of PVCs: -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

## 2023-06-08 ENCOUNTER — Ambulatory Visit: Payer: Commercial Managed Care - PPO | Attending: Cardiology | Admitting: Cardiology

## 2023-06-08 ENCOUNTER — Telehealth: Payer: Self-pay | Admitting: *Deleted

## 2023-06-08 ENCOUNTER — Ambulatory Visit (INDEPENDENT_AMBULATORY_CARE_PROVIDER_SITE_OTHER): Payer: Commercial Managed Care - PPO

## 2023-06-08 ENCOUNTER — Encounter: Payer: Self-pay | Admitting: Cardiology

## 2023-06-08 ENCOUNTER — Other Ambulatory Visit: Payer: Self-pay

## 2023-06-08 VITALS — BP 106/64 | HR 77 | Ht 68.0 in | Wt 125.0 lb

## 2023-06-08 DIAGNOSIS — I493 Ventricular premature depolarization: Secondary | ICD-10-CM

## 2023-06-08 NOTE — Telephone Encounter (Signed)
-----   Message from Flavia Shipper sent at 06/08/2023  4:46 PM EDT ----- Regarding: RE: 3 day zio Done ----- Message ----- From: Loa Socks, LPN Sent: 03/29/8118   4:38 PM EDT To: Ernst Bowler; Katrina Claria Dice Subject: 3 day zio                                      3 day zio for pvcs  Please enroll and let me know when you do  Thanks Esma Kilts

## 2023-06-08 NOTE — Patient Instructions (Signed)
Medication Instructions:   Your physician recommends that you continue on your current medications as directed. Please refer to the Current Medication list given to you today.  *If you need a refill on your cardiac medications before your next appointment, please call your pharmacy*   Testing/Procedures:  ZIO XT- Long Term Monitor Instructions  Your physician has requested you wear a ZIO patch monitor for 3 days.  This is a single patch monitor. [employer] supplies one patch monitor per enrollment. Additional stickers are not available. Please do not apply patch if you will be having a Nuclear Stress Test,  Echocardiogram, Cardiac CT, MRI, or Chest Xray during the period you would be wearing the  monitor. The patch cannot be worn during these tests. You cannot remove and re-apply the  ZIO XT patch monitor.  Your ZIO patch monitor will be mailed 3 day USPS to your address on file. It may take 3-5 days  to receive your monitor after you have been enrolled.  Once you have received your monitor, please review the enclosed instructions. Your monitor  has already been registered assigning a specific monitor serial # to you.  Billing and Patient Assistance Program Information  We have supplied [employer] with any of your insurance information on file for billing purposes. [employer] offers a sliding scale Patient Assistance Program for patients that do not have  insurance, or whose insurance does not completely cover the cost of the ZIO monitor.  You must apply for the Patient Assistance Program to qualify for this discounted rate.  To apply, please call [employer] at [phone], select option 4, select option 2, ask to apply for  Patient Assistance Program. Meredeth Ide will ask your household income, and how many people  are in your household. They will quote your out-of-pocket cost based on that information.  [employer] will also be able to set up a 80-month, interest-free payment plan if  needed.  Applying the monitor   Shave hair from upper left chest.  Hold abrader disc by orange tab. Rub abrader in 40 strokes over the upper left chest as  indicated in your monitor instructions.  Clean area with 4 enclosed alcohol pads. Let dry.  Apply patch as indicated in monitor instructions. Patch will be placed under collarbone on left  side of chest with arrow pointing upward.  Rub patch adhesive wings for 2 minutes. Remove white label marked "1". Remove the white  label marked "2". Rub patch adhesive wings for 2 additional minutes.  While looking in a mirror, press and release button in center of patch. A small green light will  flash 3-4 times. This will be your only indicator that the monitor has been turned on.  Do not shower for the first 24 hours. You may shower after the first 24 hours.  Press the button if you feel a symptom. You will hear a small click. Record Date, Time and  Symptom in the Patient Logbook.  When you are ready to remove the patch, follow instructions on the last 2 pages of Patient  Logbook. Stick patch monitor onto the last page of Patient Logbook.  Place Patient Logbook in the blue and white box. Use locking tab on box and tape box closed  securely. The blue and white box has prepaid postage on it. Please place it in the mailbox as  soon as possible. Your physician should have your test results approximately 7 days after the  monitor has been mailed back to [facility].  Call [employer]  Customer Care at [phone] if you have questions regarding  your ZIO XT patch monitor. Call them immediately if you see an orange light blinking on your  monitor.  If your monitor falls off in less than 4 days, contact our Monitor department at [phone].  If your monitor becomes loose or falls off after 4 days call [employer] at [phone] for  suggestions on securing your monitor    Follow-Up: At [facility], you and your health needs are  our priority.  As part of our continuing mission to provide you with exceptional heart care, we have created designated Provider Care Teams.  These Care Teams include your primary Cardiologist (physician) and Advanced Practice Providers (APPs -  Physician Assistants and Nurse Practitioners) who all work together to provide you with the care you need, when you need it.  We recommend signing up for the patient portal called "MyChart".  Sign up information is provided on this After Visit Summary.  MyChart is used to connect with patients for Virtual Visits (Telemedicine).  Patients are able to view lab/test results, encounter notes, upcoming appointments, etc.  Non-urgent messages can be sent to your provider as well.   To learn more about what you can do with MyChart, go to [email or web address].    Your next appointment:   1 year(s)  Provider:   With whichever Cardiologist you prefer seeing in our practice after you do more research on this.

## 2023-06-08 NOTE — Progress Notes (Unsigned)
Enrolled patient for a 3 day Zio XT monitor to be mailed to patients home [id number] returned unused. Serial # [id number] from office inventory applied . Patient has adhesive allergy, may have less reaction with new design.

## 2023-06-13 ENCOUNTER — Encounter: Payer: Self-pay | Admitting: Cardiology

## 2023-06-15 ENCOUNTER — Telehealth: Payer: Self-pay | Admitting: *Deleted

## 2023-06-15 NOTE — Telephone Encounter (Signed)
Appointment scheduled to replace mailed ZIO XT with newer model applied in office using tincture of benzoin.

## 2023-06-17 ENCOUNTER — Ambulatory Visit: Payer: Commercial Managed Care - PPO | Attending: Cardiology

## 2023-06-17 DIAGNOSIS — I493 Ventricular premature depolarization: Secondary | ICD-10-CM | POA: Diagnosis not present

## 2023-07-07 ENCOUNTER — Ambulatory Visit: Payer: Commercial Managed Care - PPO | Admitting: Internal Medicine

## 2023-07-21 ENCOUNTER — Encounter: Payer: Self-pay | Admitting: Internal Medicine

## 2023-08-17 ENCOUNTER — Telehealth: Payer: Self-pay | Admitting: Internal Medicine

## 2023-08-17 NOTE — Telephone Encounter (Signed)
Inbound call from patient requesting to speak with a nurse in regards to a referral to see a nutritionist in regurds to the fod map recommendations.   Nutritionist  she would like a referral to is Daphine Deutscher   Fax number [phone]  Please advise.

## 2023-08-17 NOTE — Telephone Encounter (Signed)
Yes, ok for referral as requested

## 2023-08-17 NOTE — Telephone Encounter (Signed)
See note below and advise if ok to refer her to dietitian.

## 2023-08-19 NOTE — Telephone Encounter (Signed)
Referral faxed as requested

## 2023-08-19 NOTE — Telephone Encounter (Signed)
Referral faxed to nutritionist as requested.

## 2023-09-30 ENCOUNTER — Ambulatory Visit: Payer: Commercial Managed Care - PPO | Admitting: Gastroenterology

## 2024-01-26 ENCOUNTER — Other Ambulatory Visit (HOSPITAL_COMMUNITY): Payer: Self-pay

## 2024-01-26 MED ORDER — ESTRADIOL 10 MCG VA TABS
10.0000 ug | ORAL_TABLET | VAGINAL | 1 refills | Status: AC
  Filled 2024-02-01: qty 36, 84d supply, fill #0

## 2024-01-27 ENCOUNTER — Other Ambulatory Visit (HOSPITAL_COMMUNITY): Payer: Self-pay

## 2024-02-01 ENCOUNTER — Other Ambulatory Visit: Payer: Self-pay

## 2024-02-01 ENCOUNTER — Other Ambulatory Visit (HOSPITAL_COMMUNITY): Payer: Self-pay

## 2024-02-03 ENCOUNTER — Other Ambulatory Visit (HOSPITAL_COMMUNITY): Payer: Self-pay

## 2024-02-07 ENCOUNTER — Other Ambulatory Visit (HOSPITAL_COMMUNITY): Payer: Self-pay

## 2024-03-13 ENCOUNTER — Other Ambulatory Visit: Payer: Self-pay | Admitting: Obstetrics and Gynecology

## 2024-03-13 DIAGNOSIS — Z1231 Encounter for screening mammogram for malignant neoplasm of breast: Secondary | ICD-10-CM

## 2024-04-19 DIAGNOSIS — Z630 Problems in relationship with spouse or partner: Secondary | ICD-10-CM | POA: Diagnosis not present

## 2024-04-19 DIAGNOSIS — F4323 Adjustment disorder with mixed anxiety and depressed mood: Secondary | ICD-10-CM | POA: Diagnosis not present

## 2024-04-28 DIAGNOSIS — F4323 Adjustment disorder with mixed anxiety and depressed mood: Secondary | ICD-10-CM | POA: Diagnosis not present

## 2024-04-28 DIAGNOSIS — Z630 Problems in relationship with spouse or partner: Secondary | ICD-10-CM | POA: Diagnosis not present

## 2024-05-05 DIAGNOSIS — F630 Pathological gambling: Secondary | ICD-10-CM | POA: Diagnosis not present

## 2024-05-05 DIAGNOSIS — F4323 Adjustment disorder with mixed anxiety and depressed mood: Secondary | ICD-10-CM | POA: Diagnosis not present

## 2024-05-15 DIAGNOSIS — F4323 Adjustment disorder with mixed anxiety and depressed mood: Secondary | ICD-10-CM | POA: Diagnosis not present

## 2024-05-15 DIAGNOSIS — F630 Pathological gambling: Secondary | ICD-10-CM | POA: Diagnosis not present

## 2024-05-16 DIAGNOSIS — M25519 Pain in unspecified shoulder: Secondary | ICD-10-CM | POA: Diagnosis not present

## 2024-05-16 DIAGNOSIS — M25559 Pain in unspecified hip: Secondary | ICD-10-CM | POA: Diagnosis not present

## 2024-05-26 DIAGNOSIS — F4323 Adjustment disorder with mixed anxiety and depressed mood: Secondary | ICD-10-CM | POA: Diagnosis not present

## 2024-05-26 DIAGNOSIS — Z630 Problems in relationship with spouse or partner: Secondary | ICD-10-CM | POA: Diagnosis not present

## 2024-05-30 DIAGNOSIS — Z630 Problems in relationship with spouse or partner: Secondary | ICD-10-CM | POA: Diagnosis not present

## 2024-05-30 DIAGNOSIS — F4323 Adjustment disorder with mixed anxiety and depressed mood: Secondary | ICD-10-CM | POA: Diagnosis not present

## 2024-05-31 DIAGNOSIS — M25559 Pain in unspecified hip: Secondary | ICD-10-CM | POA: Diagnosis not present

## 2024-05-31 DIAGNOSIS — M25519 Pain in unspecified shoulder: Secondary | ICD-10-CM | POA: Diagnosis not present

## 2024-06-09 DIAGNOSIS — Z1231 Encounter for screening mammogram for malignant neoplasm of breast: Secondary | ICD-10-CM | POA: Diagnosis not present

## 2024-06-09 DIAGNOSIS — S70361A Insect bite (nonvenomous), right thigh, initial encounter: Secondary | ICD-10-CM | POA: Diagnosis not present

## 2024-06-09 DIAGNOSIS — W57XXXA Bitten or stung by nonvenomous insect and other nonvenomous arthropods, initial encounter: Secondary | ICD-10-CM | POA: Diagnosis not present

## 2024-06-09 DIAGNOSIS — L309 Dermatitis, unspecified: Secondary | ICD-10-CM | POA: Diagnosis not present

## 2024-06-09 DIAGNOSIS — R92333 Mammographic heterogeneous density, bilateral breasts: Secondary | ICD-10-CM | POA: Diagnosis not present

## 2024-06-13 DIAGNOSIS — Z1339 Encounter for screening examination for other mental health and behavioral disorders: Secondary | ICD-10-CM | POA: Diagnosis not present

## 2024-06-13 DIAGNOSIS — Z01419 Encounter for gynecological examination (general) (routine) without abnormal findings: Secondary | ICD-10-CM | POA: Diagnosis not present

## 2024-06-21 DIAGNOSIS — R92332 Mammographic heterogeneous density, left breast: Secondary | ICD-10-CM | POA: Diagnosis not present

## 2024-06-21 DIAGNOSIS — N6002 Solitary cyst of left breast: Secondary | ICD-10-CM | POA: Diagnosis not present

## 2024-06-21 DIAGNOSIS — R922 Inconclusive mammogram: Secondary | ICD-10-CM | POA: Diagnosis not present

## 2024-07-11 DIAGNOSIS — N6320 Unspecified lump in the left breast, unspecified quadrant: Secondary | ICD-10-CM | POA: Diagnosis not present

## 2024-07-13 DIAGNOSIS — E162 Hypoglycemia, unspecified: Secondary | ICD-10-CM | POA: Diagnosis not present

## 2024-07-13 DIAGNOSIS — E785 Hyperlipidemia, unspecified: Secondary | ICD-10-CM | POA: Diagnosis not present

## 2024-07-13 DIAGNOSIS — E538 Deficiency of other specified B group vitamins: Secondary | ICD-10-CM | POA: Diagnosis not present

## 2024-07-13 DIAGNOSIS — G250 Essential tremor: Secondary | ICD-10-CM | POA: Diagnosis not present

## 2024-07-13 DIAGNOSIS — Z0000 Encounter for general adult medical examination without abnormal findings: Secondary | ICD-10-CM | POA: Diagnosis not present

## 2024-07-14 ENCOUNTER — Emergency Department (HOSPITAL_COMMUNITY)
Admission: EM | Admit: 2024-07-14 | Discharge: 2024-07-15 | Disposition: A | Discharge status: Home / Self Care | Payer: Self-pay | Attending: Emergency Medicine | Admitting: Emergency Medicine

## 2024-07-14 ENCOUNTER — Encounter (HOSPITAL_COMMUNITY): Payer: Self-pay | Admitting: Emergency Medicine

## 2024-07-14 DIAGNOSIS — S0990XA Unspecified injury of head, initial encounter: Secondary | ICD-10-CM | POA: Diagnosis not present

## 2024-07-14 DIAGNOSIS — S0181XA Laceration without foreign body of other part of head, initial encounter: Secondary | ICD-10-CM | POA: Insufficient documentation

## 2024-07-14 DIAGNOSIS — S199XXA Unspecified injury of neck, initial encounter: Secondary | ICD-10-CM | POA: Diagnosis not present

## 2024-07-14 DIAGNOSIS — Y93K1 Activity, walking an animal: Secondary | ICD-10-CM | POA: Insufficient documentation

## 2024-07-14 DIAGNOSIS — Z91010 Allergy to peanuts: Secondary | ICD-10-CM | POA: Insufficient documentation

## 2024-07-14 DIAGNOSIS — W01198A Fall on same level from slipping, tripping and stumbling with subsequent striking against other object, initial encounter: Secondary | ICD-10-CM | POA: Diagnosis not present

## 2024-07-14 DIAGNOSIS — Z91040 Latex allergy status: Secondary | ICD-10-CM | POA: Diagnosis not present

## 2024-07-14 DIAGNOSIS — R220 Localized swelling, mass and lump, head: Secondary | ICD-10-CM | POA: Diagnosis not present

## 2024-07-14 DIAGNOSIS — W19XXXA Unspecified fall, initial encounter: Secondary | ICD-10-CM | POA: Diagnosis not present

## 2024-07-14 DIAGNOSIS — W010XXA Fall on same level from slipping, tripping and stumbling without subsequent striking against object, initial encounter: Secondary | ICD-10-CM

## 2024-07-14 MED ORDER — LIDOCAINE-EPINEPHRINE (PF) 2 %-1:200000 IJ SOLN
INTRAMUSCULAR | Status: AC
  Filled 2024-07-14: qty 20

## 2024-07-14 NOTE — ED Provider Notes (Signed)
 [place] EMERGENCY DEPARTMENT AT [facility] Provider Note   CSN: [id number] Arrival date & time: 07/14/24  2254     Patient presents with: Fall and Laceration   Margaret Franco is a 58 y.o. female.   The history is provided by the patient.  Fall  Laceration She has history of GERD and comes in following a fall.  She was walking her dog when she tripped and fell and hit her head suffering a laceration.  She denies loss of consciousness but states that there was a lot of blood loss.  Last tetanus immunization was within the past year.  She denies other injury.  Denies any weakness or numbness.     Prior to Admission medications   Medication Sig Start Date End Date Taking? Authorizing Provider  Cholecalciferol (VITAMIN D3) 25 MCG (1000 UT) CAPS Take 1,000 Units by mouth daily.    [provider]  EPINEPHrine  0.3 mg/0.3 mL IJ SOAJ injection Inject 0.3 mg into the muscle as needed for anaphylaxis. 08/11/19   [provider]  Estradiol  10 MCG TABS vaginal tablet Place 1 tablet (10 mcg total) vaginally 3 (three) times a week. 06/09/23     rifaximin  (XIFAXAN ) 550 MG TABS tablet Take 1 tablet (550 mg total) by mouth 3 (three) times daily. Patient not taking: Reported on 06/08/2023 04/16/23   Albertus Gordy HERO, MD  saccharomyces boulardii (FLORASTOR) 250 MG capsule Take 2-4 weeks twice daily. Patient not taking: Reported on 06/08/2023 02/17/23   Pyrtle, Gordy HERO, MD  YUVAFEM  10 MCG TABS vaginal tablet Place 10 mcg vaginally 2 (two) times a week. 09/05/21   [provider]    Allergies: Peanut oil, Apple fruit extract, Apple juice, Barley grass, Clarithromycin, Nexium [esomeprazole], Famotidine, Guaifenesin, Iodinated contrast media, Iodine, Latex, Wheat, and Other    Review of Systems  All other systems reviewed and are negative.   Updated Vital Signs BP 117/69 (BP Location: Right Arm)   Pulse 71   Temp 97.8 F (36.6 C) (Oral)   Resp 20   Ht 5' 7  (1.702 m)   Wt 58.5 kg   LMP 01/16/2013   SpO2 100%   BMI 20.20 kg/m   Physical Exam Vitals and nursing note reviewed.   58 year old Female, resting comfortably and in no acute distress. Vital signs are normal. Oxygen saturation is 100%, which is normal. Head is normocephalic.  Laceration is present on the right side of the forehead with blood spurting from the laceration.  No other traumatic injury seen. PERRLA, EOMI.  Neck is immobilized in a stiff cervical collar and is nontender. Back is nontender. Lungs are clear without rales, wheezes, or rhonchi. Chest is nontender. Heart has regular rate and rhythm without murmur. Abdomen is soft, flat, nontender. Extremities have no swelling or deformity, full passive range of motion present of all joints without pain. Skin is warm and dry without rash. Neurologic: Awake and alert and oriented, mental status is normal, cranial nerves are intact, moves all extremities equally .   (all labs ordered are listed, but only abnormal results are displayed) Labs Reviewed  CBC WITH DIFFERENTIAL/PLATELET     Radiology: CT Head Wo Contrast Result Date: 07/15/2024 CLINICAL DATA:  Head trauma, moderate-severe; Neck trauma, dangerous injury mechanism (Age 48-64y) EXAM: CT HEAD WITHOUT CONTRAST CT CERVICAL SPINE WITHOUT CONTRAST TECHNIQUE: Multidetector CT imaging of the head and cervical spine was performed following the standard protocol without intravenous contrast. Multiplanar CT image reconstructions of the  cervical spine were also generated. RADIATION DOSE REDUCTION: This exam was performed according to the departmental dose-optimization program which includes automated exposure control, adjustment of the mA and/or kV according to patient size and/or use of iterative reconstruction technique. COMPARISON:  None Available. FINDINGS: CT HEAD FINDINGS Brain: Normal anatomic configuration. No abnormal intra or extra-axial mass lesion or fluid collection. No  abnormal mass effect or midline shift. No evidence of acute intracranial hemorrhage or infarct. Ventricular size is normal. Cerebellum unremarkable. Vascular: Unremarkable Skull: Intact Sinuses/Orbits: Paranasal sinuses are clear. Orbits are unremarkable. Other: Mastoid air cells and middle ear cavities are clear. Mild right frontal scalp soft tissue swelling CT CERVICAL SPINE FINDINGS Alignment: Normal. Skull base and vertebrae: Craniocervical alignment is normal. The atlantodental interval is not widened. No acute fracture of the cervical spine. Vertebral body height is preserved. Soft tissues and spinal canal: No prevertebral fluid or swelling. No visible canal hematoma. Disc levels: There is disc space narrowing and endplate remodeling at C5-6 in keeping with changes moderate degenerative disc disease. Prevertebral soft tissues are not thickened on sagittal reformats. No high-grade canal stenosis. No high-grade neuroforaminal narrowing. Upper chest: Negative. Other: None IMPRESSION: 1. Mild right frontal scalp soft tissue swelling. No acute intracranial abnormality. No skull fracture. 2. No acute fracture of the cervical spine. 3. Moderate C5-6 degenerative disc disease. Electronically Signed   By: Dorethia Molt M.D.   On: 07/15/2024 00:45   CT Cervical Spine Wo Contrast Result Date: 07/15/2024 CLINICAL DATA:  Head trauma, moderate-severe; Neck trauma, dangerous injury mechanism (Age 40-64y) EXAM: CT HEAD WITHOUT CONTRAST CT CERVICAL SPINE WITHOUT CONTRAST TECHNIQUE: Multidetector CT imaging of the head and cervical spine was performed following the standard protocol without intravenous contrast. Multiplanar CT image reconstructions of the cervical spine were also generated. RADIATION DOSE REDUCTION: This exam was performed according to the departmental dose-optimization program which includes automated exposure control, adjustment of the mA and/or kV according to patient size and/or use of iterative  reconstruction technique. COMPARISON:  None Available. FINDINGS: CT HEAD FINDINGS Brain: Normal anatomic configuration. No abnormal intra or extra-axial mass lesion or fluid collection. No abnormal mass effect or midline shift. No evidence of acute intracranial hemorrhage or infarct. Ventricular size is normal. Cerebellum unremarkable. Vascular: Unremarkable Skull: Intact Sinuses/Orbits: Paranasal sinuses are clear. Orbits are unremarkable. Other: Mastoid air cells and middle ear cavities are clear. Mild right frontal scalp soft tissue swelling CT CERVICAL SPINE FINDINGS Alignment: Normal. Skull base and vertebrae: Craniocervical alignment is normal. The atlantodental interval is not widened. No acute fracture of the cervical spine. Vertebral body height is preserved. Soft tissues and spinal canal: No prevertebral fluid or swelling. No visible canal hematoma. Disc levels: There is disc space narrowing and endplate remodeling at C5-6 in keeping with changes moderate degenerative disc disease. Prevertebral soft tissues are not thickened on sagittal reformats. No high-grade canal stenosis. No high-grade neuroforaminal narrowing. Upper chest: Negative. Other: None IMPRESSION: 1. Mild right frontal scalp soft tissue swelling. No acute intracranial abnormality. No skull fracture. 2. No acute fracture of the cervical spine. 3. Moderate C5-6 degenerative disc disease. Electronically Signed   By: Dorethia Molt M.D.   On: 07/15/2024 00:45     .Laceration Repair  Date/Time: 07/14/2024 11:42 PM  Performed by: Raford Lenis, MD Authorized by: Raford Lenis, MD   Consent:    Consent obtained:  Verbal   Consent given by:  Patient   Risks, benefits, and alternatives were discussed: yes     Risks discussed:  Pain, poor cosmetic result and vascular damage   Alternatives discussed:  No treatment Universal protocol:    Procedure explained and questions answered to patient or proxy's satisfaction: yes     Relevant  documents present and verified: yes     Required blood products, implants, devices, and special equipment available: yes     Site/side marked: yes     Immediately prior to procedure, a time out was called: yes     Patient identity confirmed:  Verbally with patient and arm band Laceration details:    Location:  Face   Face location:  Forehead   Length (cm):  3   Depth (mm):  3 Pre-procedure details:    Preparation:  Patient was prepped and draped in usual sterile fashion Exploration:    Limited defect created (wound extended): no     Hemostasis achieved with:  Direct pressure   Wound exploration: entire depth of wound visualized     Wound extent: no foreign body, no underlying fracture and no vascular damage     Contaminated: no   Treatment:    Area cleansed with:  Saline   Amount of cleaning:  Standard   Debridement:  None   Undermining:  None Skin repair:    Repair method:  Sutures   Suture size:  5-0   Suture material:  Nylon   Suture technique:  Simple interrupted   Number of sutures:  6 Approximation:    Approximation:  Close Repair type:    Repair type:  Simple Post-procedure details:    Dressing:  Antibiotic ointment   Procedure completion:  Tolerated well, no immediate complications    Medications Ordered in the ED  lidocaine -EPINEPHrine  (XYLOCAINE  W/EPI) 2 %-1:200000 (PF) injection (has no administration in time range)                                    Medical Decision Making Amount and/or Complexity of Data Reviewed Labs: ordered. Radiology: ordered.   Fall with head injury with forehead laceration.  I have closed the laceration with sutures and I have ordered CT of head and cervical spine and CBC for baseline.  I doubt blood loss of clinical significance.  I reviewed her laboratory tests, my interpretation is normal CBC-no evidence of significant blood loss.  CT of head and cervical spine show a small amount of swelling of the right frontal scalp  corresponds to where her laceration is but no evidence of intracranial injury and no acute cervical spine injury.  I have independently viewed the images, and agree with the radiologist's interpretation.  I am discharging her with instructions to apply ice to sore areas, use over-the-counter NSAIDs and acetaminophen  as needed for pain.  I have recommended sutures to be removed in 3-5 days.     Final diagnoses:  Fall from slip, trip, or stumble, initial encounter  Forehead laceration, initial encounter    ED Discharge Orders     None          Raford Lenis, MD 07/15/24 0126

## 2024-07-14 NOTE — ED Triage Notes (Signed)
 58 y/o female comes in c/o a forehead laceration after a mechanical fall, striking her head on the pavement. PT reports she tripped over her dog and pt denies any LOC, blood thinners, but does reports a headache. C-collar in place on arrival, no neck painted reports or noted on palpation

## 2024-07-15 ENCOUNTER — Emergency Department (HOSPITAL_COMMUNITY)

## 2024-07-15 ENCOUNTER — Other Ambulatory Visit: Payer: Self-pay

## 2024-07-15 DIAGNOSIS — S0990XA Unspecified injury of head, initial encounter: Secondary | ICD-10-CM | POA: Diagnosis not present

## 2024-07-15 DIAGNOSIS — R220 Localized swelling, mass and lump, head: Secondary | ICD-10-CM | POA: Diagnosis not present

## 2024-07-15 DIAGNOSIS — S199XXA Unspecified injury of neck, initial encounter: Secondary | ICD-10-CM | POA: Diagnosis not present

## 2024-07-15 LAB — CBC WITH DIFFERENTIAL/PLATELET
Abs Immature Granulocytes: 0.02 K/uL (ref 0.00–0.07)
Basophils Absolute: 0 K/uL (ref 0.0–0.1)
Basophils Relative: 0 %
Eosinophils Absolute: 0.1 K/uL (ref 0.0–0.5)
Eosinophils Relative: 1 %
HCT: 38 % (ref 36.0–46.0)
Hemoglobin: 12 g/dL (ref 12.0–15.0)
Immature Granulocytes: 0 %
Lymphocytes Relative: 29 %
Lymphs Abs: 2.2 K/uL (ref 0.7–4.0)
MCH: 30.8 pg (ref 26.0–34.0)
MCHC: 31.6 g/dL (ref 30.0–36.0)
MCV: 97.7 fL (ref 80.0–100.0)
Monocytes Absolute: 0.5 K/uL (ref 0.1–1.0)
Monocytes Relative: 7 %
Neutro Abs: 4.6 K/uL (ref 1.7–7.7)
Neutrophils Relative %: 63 %
Platelets: 208 K/uL (ref 150–400)
RBC: 3.89 MIL/uL (ref 3.87–5.11)
RDW: 12.3 % (ref 11.5–15.5)
WBC: 7.5 K/uL (ref 4.0–10.5)
nRBC: 0 % (ref 0.0–0.2)

## 2024-07-15 NOTE — Discharge Instructions (Addendum)
 Apply ice to areas that are swollen or sore.  Ice should be applied for 30 minutes at a time, 4 times a day.  You may take acetaminophen  and/or ibuprofen  as needed for pain.  Please note that if you combine acetaminophen  and ibuprofen , he will get better pain relief and you get from taking either medication by itself.  Sutures should be removed in 3-5 days.  Please do not leave them in for more than 5 days because that can increase the amount of scarring.

## 2024-07-19 DIAGNOSIS — H6122 Impacted cerumen, left ear: Secondary | ICD-10-CM | POA: Diagnosis not present

## 2024-07-19 DIAGNOSIS — Z4802 Encounter for removal of sutures: Secondary | ICD-10-CM | POA: Diagnosis not present

## 2024-07-19 DIAGNOSIS — S060X9A Concussion with loss of consciousness of unspecified duration, initial encounter: Secondary | ICD-10-CM | POA: Diagnosis not present

## 2024-08-08 DIAGNOSIS — E538 Deficiency of other specified B group vitamins: Secondary | ICD-10-CM | POA: Diagnosis not present

## 2024-08-08 DIAGNOSIS — Z0000 Encounter for general adult medical examination without abnormal findings: Secondary | ICD-10-CM | POA: Diagnosis not present

## 2024-08-08 DIAGNOSIS — E785 Hyperlipidemia, unspecified: Secondary | ICD-10-CM | POA: Diagnosis not present

## 2024-08-08 DIAGNOSIS — E162 Hypoglycemia, unspecified: Secondary | ICD-10-CM | POA: Diagnosis not present

## 2024-08-08 DIAGNOSIS — E559 Vitamin D deficiency, unspecified: Secondary | ICD-10-CM | POA: Diagnosis not present

## 2024-11-07 DIAGNOSIS — D225 Melanocytic nevi of trunk: Secondary | ICD-10-CM | POA: Diagnosis not present

## 2024-11-07 DIAGNOSIS — L814 Other melanin hyperpigmentation: Secondary | ICD-10-CM | POA: Diagnosis not present

## 2024-11-07 DIAGNOSIS — L821 Other seborrheic keratosis: Secondary | ICD-10-CM | POA: Diagnosis not present

## 2024-11-07 DIAGNOSIS — Z86018 Personal history of other benign neoplasm: Secondary | ICD-10-CM | POA: Diagnosis not present

## 2024-12-18 ENCOUNTER — Telehealth: Admitting: Gastroenterology

## 2024-12-18 ENCOUNTER — Telehealth: Payer: Self-pay | Admitting: Gastroenterology

## 2024-12-18 DIAGNOSIS — R198 Other specified symptoms and signs involving the digestive system and abdomen: Secondary | ICD-10-CM

## 2024-12-18 DIAGNOSIS — Z860100 Personal history of colon polyps, unspecified: Secondary | ICD-10-CM | POA: Diagnosis not present

## 2024-12-18 DIAGNOSIS — R140 Abdominal distension (gaseous): Secondary | ICD-10-CM

## 2024-12-18 DIAGNOSIS — Z85038 Personal history of other malignant neoplasm of large intestine: Secondary | ICD-10-CM | POA: Diagnosis not present

## 2024-12-18 DIAGNOSIS — Z800 Family history of malignant neoplasm of digestive organs: Secondary | ICD-10-CM

## 2024-12-18 DIAGNOSIS — K589 Irritable bowel syndrome without diarrhea: Secondary | ICD-10-CM

## 2024-12-18 NOTE — Telephone Encounter (Signed)
"  Patient message  "

## 2024-12-18 NOTE — Progress Notes (Signed)
 "  Chief Complaint: SIBO Primary GI MD: Dr. Albertus  HPI: Virtual Visit via Video Note  I connected with Veva DELENA Purple on 12/18/2024 at  2:30 PM EST by a video enabled telemedicine application and verified that I am speaking with the correct person using two identifiers.  Location: Patient: Home Provider: [place] [facility] office on [address]   I discussed the limitations of evaluation and management by telemedicine and the availability of in person appointments. The patient expressed understanding and agreed to proceed.  History of Present Illness:   Discussed the use of AI scribe software for clinical note transcription with the patient, who gave verbal consent to proceed.   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  Patient was last seen by Dr. Albertus 01/2023 and at that time was having bloating and malodorous flatulence.  Was recommended to do a trial of Xifaxan  but patient preferred to hold off as she had taken multiple antibiotics at that time and prefers holistic medicine approaches that are more natural.  She stuck to the low FODMAP diet with improvement but feels it is difficult to do.  Hesitant about trying an antibiotic but has been told she cannot pursue SIBO testing as she has hypoglycemia.  The patient avoids gluten and dairy, and has discussed FODMAP foods and dietary triggers with her clinician.  Has eliminated all dairy, including hidden sources, and avoids gluten-containing foods due to gluten sensitivity.  The patient is familiar with the FODMAP diet and finds it challenging to follow.   Past Medical History:  Diagnosis Date   Adenopathy    submandibular   Anxiety    Chest pain    pt reports hx of  fleeting chest twinges' pt reports having told PCP in past most recent has been in last week   Complication of anesthesia     DR medoff did surgery- passed out after procedure 8 years but with last surgery did fine- colonoscopy   Depression    Fibroids    GERD (gastroesophageal reflux disease)    Headache    History of colon polyps    Hypoglycemia    hx of per pt   Leukocytosis 10/21/2010   mild   Pneumonia    PVC (premature ventricular contraction)    hx of    Past Surgical History:  Procedure Laterality Date   ABDOMINAL HYSTERECTOMY N/A 11/27/2021   Procedure: ABDOMINAL SUPRACERVICAL HYSTERECTOMY BILATERAL SALPINGECTOMY;  Surgeon: Darcel Pool, MD;  Location: [facility];  Service: Gynecology;  Laterality: N/A;   BACK SURGERY     COLONOSCOPY     ESOPHAGOGASTRODUODENOSCOPY ENDOSCOPY     wisdom teeth extractoin       Current Outpatient Medications  Medication Sig Dispense Refill   Cholecalciferol (VITAMIN D3) 25 MCG (1000 UT) CAPS Take 1,000 Units by mouth daily.     EPINEPHrine  0.3 mg/0.3 mL IJ SOAJ injection Inject 0.3 mg into the muscle as needed for anaphylaxis.     Estradiol  10 MCG TABS vaginal tablet Place 1 tablet (10 mcg total) vaginally 3 (three) times a week. 36 tablet 1   rifaximin  (XIFAXAN ) 550 MG TABS tablet Take 1 tablet (550 mg total) by mouth 3 (three) times daily. (Patient not taking: Reported on 06/08/2023) 42 tablet 0   saccharomyces boulardii (FLORASTOR) 250 MG capsule Take 2-4 weeks twice daily. (Patient not taking: Reported on 06/08/2023) 60 capsule 0  YUVAFEM  10 MCG TABS vaginal tablet Place 10 mcg vaginally 2 (two) times a week.     No current facility-administered medications for this visit.    Allergies as of 12/18/2024 - Review Complete 07/14/2024  Allergen Reaction Noted   Peanut oil Swelling 09/29/2012   Apple fruit extract Nausea Only and Nausea And Vomiting 06/08/2023   Apple juice Hives 10/15/2021   Barley grass Hives 10/15/2021   Clarithromycin Hives 09/29/2012   Nexium [esomeprazole] Other (See Comments) 11/27/2021   Famotidine Other (See Comments) 11/17/2021    Guaifenesin Hives 08/31/2019   Iodinated contrast media Hives 08/31/2019   Iodine Hives 08/31/2019   Latex  09/29/2012   Wheat Hives 09/29/2012   Other Hives and Rash 09/29/2012    Family History  Problem Relation Age of Onset   Migraines Mother    Heart disease Mother    Hypertension Mother    Hypertension Father    Diabetes Father    Cancer - Prostate Father    CVA Father    Colon cancer Father    Asthma Sister    Migraines Brother    Breast cancer Maternal Grandmother    Stomach cancer Maternal Grandfather    Cancer - Prostate Paternal Grandfather    CVA Paternal Grandfather     Social History   Socioeconomic History   Marital status: Married    Spouse name: Not on file   Number of children: Not on file   Years of education: Not on file   Highest education level: Not on file  Occupational History   Not on file  Tobacco Use   Smoking status: Never   Smokeless tobacco: Never  Vaping Use   Vaping status: Never Used  Substance and Sexual Activity   Alcohol use: Never   Drug use: Never   Sexual activity: Yes    Birth control/protection: Condom  Other Topics Concern   Not on file  Social History Narrative   Not on file   Social Drivers of Health   Tobacco Use: Low Risk (07/14/2024)   Patient History    Smoking Tobacco Use: Never    Smokeless Tobacco Use: Never    Passive Exposure: Not on file  Financial Resource Strain: Not on file  Food Insecurity: Not on file  Transportation Needs: Not on file  Physical Activity: Not on file  Stress: Not on file  Social Connections: Not on file  Intimate Partner Violence: Not on file  Depression (PHQ2-9): Not on file  Alcohol Screen: Not on file  Housing: Not on file  Utilities: Not on file  Health Literacy: Not on file    Review of Systems:    Constitutional: No weight loss, fever, chills, weakness or fatigue HEENT: Eyes: No change in vision               Ears, Nose, Throat:  No change in hearing or  congestion Skin: No rash or itching Cardiovascular: No chest pain, chest pressure or palpitations   Respiratory: No SOB or cough Gastrointestinal: See HPI and otherwise negative Genitourinary: No dysuria or change in urinary frequency Neurological: No headache, dizziness or syncope Musculoskeletal: No new muscle or joint pain Hematologic: No bleeding or bruising Psychiatric: No history of depression or anxiety    Physical Exam:  Vital signs: LMP 01/16/2013   Constitutional: NAD, alert and cooperative Head:  Normocephalic and atraumatic. Respiratory: Respirations even and unlabored. Neurologic:  Alert and  oriented x4;  grossly normal neurologically.  Skin:   Dry  and intact without significant lesions or rashes as visualized in video. Psychiatric: Oriented to person, place and time. Demonstrates good judgement and reason without abnormal affect or behaviors.   RELEVANT LABS AND IMAGING: CBC    Component Value Date/Time   WBC 7.5 07/14/2024 2355   RBC 3.89 07/14/2024 2355   HGB 12.0 07/14/2024 2355   HCT 38.0 07/14/2024 2355   PLT 208 07/14/2024 2355   MCV 97.7 07/14/2024 2355   MCH 30.8 07/14/2024 2355   MCHC 31.6 07/14/2024 2355   RDW 12.3 07/14/2024 2355   LYMPHSABS 2.2 07/14/2024 2355   MONOABS 0.5 07/14/2024 2355   EOSABS 0.1 07/14/2024 2355   BASOSABS 0.0 07/14/2024 2355    CMP     Component Value Date/Time   NA 141 02/17/2023 0913   K 3.7 02/17/2023 0913   CL 103 02/17/2023 0913   CO2 30 02/17/2023 0913   GLUCOSE 90 02/17/2023 0913   BUN 14 02/17/2023 0913   CREATININE 0.86 02/17/2023 0913   CALCIUM 9.9 02/17/2023 0913   PROT 6.9 02/17/2023 0913   ALBUMIN 4.1 02/17/2023 0913   AST 18 02/17/2023 0913   ALT 17 02/17/2023 0913   ALKPHOS 54 02/17/2023 0913   BILITOT 0.6 02/17/2023 0913   GFRNONAA >60 11/28/2021 0523   GFRAA  06/30/2007 0031    >60        The eGFR has been calculated using the MDRD equation. This calculation has not been validated in  all clinical     Assessment/Plan:   58 year old female with a history of remote colon polyps, family history of colon cancer in her father in his 30s, history of GERD diet controlled, uterine fibroids status post hysterectomy, history of anxiety presents for follow-up of SIBO  IBS/SIBO Symptoms characterized by bloating and flatulence with improvement on FODMAP diet but difficult to sustain.  Cannot tolerate SIBO breath test due to reported history of hypoglycemia, though past glucose readings appear stable and she does not have history of diabetes.  Would prefer not to do trial of antibiotic. - Offered Xifaxan , patient politely declined she would like to try continue FODMAP diet - Discussed FODMAP diet in detail and provided patient education handout - If no improvement with FODMAP diet including apps to use on phone would recommend trial of Xifaxan  to rule out SIBO - Continue to refrain from gluten and dairy as these are intolerances for her -- consider SIBO breath test if FODMAP diet isn't helpful and patient feels she could tolerate the fast.  History of colon cancer History of colon polyps -Due for repeat colonoscopy May 2026  I discussed the assessment and treatment plan with the patient. The patient was provided an opportunity to ask questions and all were answered. The patient agreed with the plan and demonstrated an understanding of the instructions.   The patient was advised to call back or seek an in-person evaluation if the symptoms worsen or if the condition fails to improve as anticipated.  I provided 20 minutes of non-face-to-face time during this encounter.   Tianni Escamilla CHRISTELLA Blower, PA-C     [facility]  Gastroenterology 12/18/2024, 3:28 PM  Cc: Teresa Channel, MD "

## 2025-01-23 ENCOUNTER — Ambulatory Visit: Admitting: Physician Assistant
